# Patient Record
Sex: Female | Born: 1935 | Race: Black or African American | Hispanic: No | Smoking: Former smoker
Health system: Southern US, Community
[De-identification: ages and names within clinical notes are randomized; demographics above are authoritative.]

## PROBLEM LIST (undated history)

## (undated) DIAGNOSIS — I10 Essential (primary) hypertension: Secondary | ICD-10-CM

## (undated) HISTORY — PX: REPLACEMENT TOTAL KNEE: SUR1224

---

## 2004-08-05 ENCOUNTER — Ambulatory Visit: Payer: Self-pay | Admitting: Internal Medicine

## 2004-08-18 ENCOUNTER — Emergency Department: Payer: Self-pay | Admitting: Emergency Medicine

## 2005-01-10 ENCOUNTER — Ambulatory Visit: Payer: Self-pay | Admitting: Podiatry

## 2005-06-27 ENCOUNTER — Emergency Department: Payer: Self-pay | Admitting: Emergency Medicine

## 2005-09-03 ENCOUNTER — Ambulatory Visit: Payer: Self-pay | Admitting: Internal Medicine

## 2006-08-13 ENCOUNTER — Ambulatory Visit: Payer: Self-pay | Admitting: Internal Medicine

## 2007-08-18 ENCOUNTER — Ambulatory Visit: Payer: Self-pay | Admitting: Internal Medicine

## 2008-08-21 ENCOUNTER — Ambulatory Visit: Payer: Self-pay | Admitting: Internal Medicine

## 2009-08-22 ENCOUNTER — Ambulatory Visit: Payer: Self-pay | Admitting: Internal Medicine

## 2010-09-04 ENCOUNTER — Ambulatory Visit: Payer: Self-pay | Admitting: Internal Medicine

## 2010-09-05 ENCOUNTER — Ambulatory Visit: Payer: Self-pay | Admitting: Internal Medicine

## 2011-09-12 ENCOUNTER — Ambulatory Visit: Payer: Self-pay | Admitting: Internal Medicine

## 2012-09-14 ENCOUNTER — Ambulatory Visit: Payer: Self-pay | Admitting: Internal Medicine

## 2013-10-05 ENCOUNTER — Ambulatory Visit: Payer: Self-pay | Admitting: Internal Medicine

## 2017-05-13 ENCOUNTER — Other Ambulatory Visit (HOSPITAL_COMMUNITY): Payer: Self-pay | Admitting: Hematology

## 2017-05-18 ENCOUNTER — Other Ambulatory Visit (HOSPITAL_COMMUNITY): Payer: Self-pay | Admitting: Pharmacist

## 2020-03-23 ENCOUNTER — Encounter
Admission: EM | Disposition: A | Discharge status: Skilled Nursing Facility | Payer: Self-pay | Source: Home / Self Care | Attending: Internal Medicine

## 2020-03-23 ENCOUNTER — Emergency Department: Payer: Medicare HMO

## 2020-03-23 ENCOUNTER — Encounter: Payer: Self-pay | Admitting: Internal Medicine

## 2020-03-23 ENCOUNTER — Other Ambulatory Visit: Payer: Self-pay

## 2020-03-23 ENCOUNTER — Inpatient Hospital Stay: Payer: Medicare HMO | Admitting: Certified Registered"

## 2020-03-23 ENCOUNTER — Inpatient Hospital Stay: Payer: Medicare HMO

## 2020-03-23 ENCOUNTER — Inpatient Hospital Stay
Admission: EM | Admit: 2020-03-23 | Discharge: 2020-03-29 | DRG: 481 | Disposition: A | Discharge status: Skilled Nursing Facility | Payer: Medicare HMO | Attending: Family Medicine | Admitting: Family Medicine

## 2020-03-23 DIAGNOSIS — S72142A Displaced intertrochanteric fracture of left femur, initial encounter for closed fracture: Secondary | ICD-10-CM | POA: Diagnosis present

## 2020-03-23 DIAGNOSIS — Z6820 Body mass index (BMI) 20.0-20.9, adult: Secondary | ICD-10-CM | POA: Diagnosis not present

## 2020-03-23 DIAGNOSIS — Z419 Encounter for procedure for purposes other than remedying health state, unspecified: Secondary | ICD-10-CM

## 2020-03-23 DIAGNOSIS — D62 Acute posthemorrhagic anemia: Secondary | ICD-10-CM

## 2020-03-23 DIAGNOSIS — Z79899 Other long term (current) drug therapy: Secondary | ICD-10-CM

## 2020-03-23 DIAGNOSIS — W19XXXA Unspecified fall, initial encounter: Secondary | ICD-10-CM | POA: Diagnosis present

## 2020-03-23 DIAGNOSIS — Z87891 Personal history of nicotine dependence: Secondary | ICD-10-CM

## 2020-03-23 DIAGNOSIS — Z20822 Contact with and (suspected) exposure to covid-19: Secondary | ICD-10-CM | POA: Diagnosis present

## 2020-03-23 DIAGNOSIS — Y92009 Unspecified place in unspecified non-institutional (private) residence as the place of occurrence of the external cause: Secondary | ICD-10-CM

## 2020-03-23 DIAGNOSIS — I10 Essential (primary) hypertension: Secondary | ICD-10-CM | POA: Diagnosis present

## 2020-03-23 DIAGNOSIS — R451 Restlessness and agitation: Secondary | ICD-10-CM | POA: Diagnosis not present

## 2020-03-23 DIAGNOSIS — S72002A Fracture of unspecified part of neck of left femur, initial encounter for closed fracture: Secondary | ICD-10-CM | POA: Diagnosis present

## 2020-03-23 DIAGNOSIS — D696 Thrombocytopenia, unspecified: Secondary | ICD-10-CM | POA: Diagnosis present

## 2020-03-23 DIAGNOSIS — D649 Anemia, unspecified: Secondary | ICD-10-CM | POA: Diagnosis not present

## 2020-03-23 DIAGNOSIS — Z96651 Presence of right artificial knee joint: Secondary | ICD-10-CM | POA: Diagnosis present

## 2020-03-23 DIAGNOSIS — F05 Delirium due to known physiological condition: Secondary | ICD-10-CM | POA: Diagnosis not present

## 2020-03-23 DIAGNOSIS — Z66 Do not resuscitate: Secondary | ICD-10-CM | POA: Diagnosis present

## 2020-03-23 DIAGNOSIS — E440 Moderate protein-calorie malnutrition: Secondary | ICD-10-CM | POA: Diagnosis present

## 2020-03-23 DIAGNOSIS — Z8249 Family history of ischemic heart disease and other diseases of the circulatory system: Secondary | ICD-10-CM

## 2020-03-23 HISTORY — PX: INTRAMEDULLARY (IM) NAIL INTERTROCHANTERIC: SHX5875

## 2020-03-23 HISTORY — DX: Unspecified fall, initial encounter: W19.XXXA

## 2020-03-23 HISTORY — DX: Fracture of unspecified part of neck of left femur, initial encounter for closed fracture: S72.002A

## 2020-03-23 HISTORY — DX: Essential (primary) hypertension: I10

## 2020-03-23 LAB — BASIC METABOLIC PANEL
Anion gap: 6 (ref 5–15)
BUN: 22 mg/dL (ref 8–23)
CO2: 26 mmol/L (ref 22–32)
Calcium: 8.9 mg/dL (ref 8.9–10.3)
Chloride: 109 mmol/L (ref 98–111)
Creatinine, Ser: 0.83 mg/dL (ref 0.44–1.00)
GFR, Estimated: >60 mL/min (ref >60)
Glucose, Bld: 103 mg/dL — ABNORMAL HIGH (ref 70–99)
Potassium: 4 mmol/L (ref 3.5–5.1)
Sodium: 141 mmol/L (ref 135–145)

## 2020-03-23 LAB — CBC WITH DIFFERENTIAL/PLATELET
Abs Immature Granulocytes: 0.04 10*3/uL (ref 0.00–0.07)
Basophils Absolute: 0 10*3/uL (ref 0.0–0.1)
Basophils Relative: 0 %
Eosinophils Absolute: 0.1 10*3/uL (ref 0.0–0.5)
Eosinophils Relative: 1 %
HCT: 33.5 % — ABNORMAL LOW (ref 36.0–46.0)
Hemoglobin: 10.3 g/dL — ABNORMAL LOW (ref 12.0–15.0)
Immature Granulocytes: 1 %
Lymphocytes Relative: 21 %
Lymphs Abs: 1.2 10*3/uL (ref 0.7–4.0)
MCH: 29.5 pg (ref 26.0–34.0)
MCHC: 30.7 g/dL (ref 30.0–36.0)
MCV: 96 fL (ref 80.0–100.0)
Monocytes Absolute: 0.4 10*3/uL (ref 0.1–1.0)
Monocytes Relative: 7 %
Neutro Abs: 4.1 10*3/uL (ref 1.7–7.7)
Neutrophils Relative %: 70 %
Platelets: 171 10*3/uL (ref 150–400)
RBC: 3.49 MIL/uL — ABNORMAL LOW (ref 3.87–5.11)
RDW: 14.7 % (ref 11.5–15.5)
WBC: 5.9 10*3/uL (ref 4.0–10.5)
nRBC: 0 % (ref 0.0–0.2)

## 2020-03-23 LAB — PROTIME-INR
INR: 1 (ref 0.8–1.2)
Prothrombin Time: 12.8 seconds (ref 11.4–15.2)

## 2020-03-23 LAB — RESP PANEL BY RT-PCR (FLU A&B, COVID) ARPGX2
Influenza A by PCR: NEGATIVE
Influenza B by PCR: NEGATIVE
SARS Coronavirus 2 by RT PCR: NEGATIVE

## 2020-03-23 LAB — APTT: aPTT: 35 seconds (ref 24–36)

## 2020-03-23 SURGERY — FIXATION, FRACTURE, INTERTROCHANTERIC, WITH INTRAMEDULLARY ROD
Anesthesia: Spinal | Laterality: Left

## 2020-03-23 MED ORDER — ONDANSETRON HCL 4 MG/2ML IJ SOLN: 4.0000 mg | Freq: Three times a day (TID) | INTRAMUSCULAR | Status: DC | PRN

## 2020-03-23 MED ORDER — TRAMADOL HCL 50 MG PO TABS: 50.0000 mg | ORAL_TABLET | Freq: Four times a day (QID) | ORAL | Status: DC | PRN

## 2020-03-23 MED ORDER — FENTANYL CITRATE (PF) 100 MCG/2ML IJ SOLN
50.0000 ug | Freq: Once | INTRAMUSCULAR | Status: AC
  Administered 2020-03-23: 50 ug via INTRAVENOUS
  Filled 2020-03-23: qty 2

## 2020-03-23 MED ORDER — MORPHINE SULFATE (PF) 2 MG/ML IV SOLN: 1.0000 mg | INTRAVENOUS | Status: DC | PRN

## 2020-03-23 MED ORDER — SODIUM CHLORIDE 0.9 % IV SOLN: INTRAVENOUS | Status: DC

## 2020-03-23 MED ORDER — ENOXAPARIN SODIUM 40 MG/0.4ML ~~LOC~~ SOLN
40.0000 mg | SUBCUTANEOUS | Status: DC
  Administered 2020-03-24: 40 mg via SUBCUTANEOUS
  Filled 2020-03-23 (×2): qty 0.4

## 2020-03-23 MED ORDER — BUPIVACAINE HCL (PF) 0.5 % IJ SOLN
INTRAMUSCULAR | Status: DC | PRN
  Administered 2020-03-23: 30 mL

## 2020-03-23 MED ORDER — MEPERIDINE HCL 50 MG/ML IJ SOLN
INTRAMUSCULAR | Status: AC
  Filled 2020-03-23: qty 1

## 2020-03-23 MED ORDER — BUPIVACAINE HCL (PF) 0.5 % IJ SOLN
INTRAMUSCULAR | Status: DC | PRN
  Administered 2020-03-23: 2.8 mL via INTRATHECAL

## 2020-03-23 MED ORDER — VASOPRESSIN 20 UNIT/ML IV SOLN
INTRAVENOUS | Status: DC | PRN
  Administered 2020-03-23 (×2): 1 [IU] via INTRAVENOUS
  Administered 2020-03-23: 2 [IU] via INTRAVENOUS
  Administered 2020-03-23: 1 [IU] via INTRAVENOUS
  Administered 2020-03-23: 2 [IU] via INTRAVENOUS

## 2020-03-23 MED ORDER — LISINOPRIL 20 MG PO TABS
20.0000 mg | ORAL_TABLET | Freq: Every day | ORAL | Status: DC
Start: 2020-03-23 — End: 2020-03-27
  Filled 2020-03-23 (×3): qty 1

## 2020-03-23 MED ORDER — HYDROCHLOROTHIAZIDE 25 MG PO TABS
25.0000 mg | ORAL_TABLET | Freq: Every day | ORAL | Status: DC
  Administered 2020-03-26: 25 mg via ORAL
  Filled 2020-03-23 (×3): qty 1

## 2020-03-23 MED ORDER — LACTATED RINGERS IV SOLN: INTRAVENOUS | Status: DC

## 2020-03-23 MED ORDER — ONDANSETRON HCL 4 MG/2ML IJ SOLN: 4.0000 mg | Freq: Once | INTRAMUSCULAR | Status: DC | PRN

## 2020-03-23 MED ORDER — SENNOSIDES-DOCUSATE SODIUM 8.6-50 MG PO TABS: 1.0000 | ORAL_TABLET | Freq: Every evening | ORAL | Status: DC | PRN

## 2020-03-23 MED ORDER — METOCLOPRAMIDE HCL 5 MG/ML IJ SOLN: 5.0000 mg | Freq: Three times a day (TID) | INTRAMUSCULAR | Status: DC | PRN

## 2020-03-23 MED ORDER — FENTANYL CITRATE (PF) 100 MCG/2ML IJ SOLN: 25.0000 ug | INTRAMUSCULAR | Status: DC | PRN

## 2020-03-23 MED ORDER — ACETAMINOPHEN 500 MG PO TABS
1000.0000 mg | ORAL_TABLET | Freq: Three times a day (TID) | ORAL | Status: DC
  Administered 2020-03-23 – 2020-03-29 (×14): 1000 mg via ORAL
  Filled 2020-03-23 (×15): qty 2

## 2020-03-23 MED ORDER — ACETAMINOPHEN 325 MG PO TABS: 650.0000 mg | ORAL_TABLET | Freq: Four times a day (QID) | ORAL | Status: DC | PRN

## 2020-03-23 MED ORDER — ONDANSETRON HCL 4 MG PO TABS
4.0000 mg | ORAL_TABLET | Freq: Four times a day (QID) | ORAL | Status: DC | PRN
  Administered 2020-03-23: 4 mg via ORAL
  Filled 2020-03-23: qty 1

## 2020-03-23 MED ORDER — CEFAZOLIN SODIUM-DEXTROSE 1-4 GM/50ML-% IV SOLN
1.0000 g | Freq: Three times a day (TID) | INTRAVENOUS | Status: DC
  Administered 2020-03-23: 1 g via INTRAVENOUS
  Filled 2020-03-23 (×4): qty 50

## 2020-03-23 MED ORDER — METOCLOPRAMIDE HCL 10 MG PO TABS: 5.0000 mg | ORAL_TABLET | Freq: Three times a day (TID) | ORAL | Status: DC | PRN

## 2020-03-23 MED ORDER — PROPOFOL 500 MG/50ML IV EMUL
INTRAVENOUS | Status: AC
  Filled 2020-03-23: qty 50

## 2020-03-23 MED ORDER — EPHEDRINE SULFATE 50 MG/ML IJ SOLN
INTRAMUSCULAR | Status: DC | PRN
  Administered 2020-03-23: 5 mg via INTRAVENOUS

## 2020-03-23 MED ORDER — OXYCODONE HCL 5 MG PO TABS
2.5000 mg | ORAL_TABLET | ORAL | Status: DC | PRN
  Administered 2020-03-23 – 2020-03-24 (×2): 5 mg via ORAL
  Filled 2020-03-23: qty 1

## 2020-03-23 MED ORDER — PROPOFOL 500 MG/50ML IV EMUL
INTRAVENOUS | Status: DC | PRN
  Administered 2020-03-23: 50 ug/kg/min via INTRAVENOUS

## 2020-03-23 MED ORDER — CHLORHEXIDINE GLUCONATE CLOTH 2 % EX PADS
6.0000 | MEDICATED_PAD | Freq: Every day | CUTANEOUS | Status: DC
  Administered 2020-03-24 – 2020-03-27 (×2): 6 via TOPICAL

## 2020-03-23 MED ORDER — NEOMYCIN-POLYMYXIN B GU 40-200000 IR SOLN
Status: AC
  Filled 2020-03-23: qty 4

## 2020-03-23 MED ORDER — METHOCARBAMOL 1000 MG/10ML IJ SOLN
500.0000 mg | Freq: Four times a day (QID) | INTRAVENOUS | Status: DC | PRN
  Filled 2020-03-23: qty 5

## 2020-03-23 MED ORDER — MORPHINE SULFATE (PF) 4 MG/ML IV SOLN
4.0000 mg | Freq: Once | INTRAVENOUS | Status: AC
  Administered 2020-03-23: 4 mg via INTRAVENOUS
  Filled 2020-03-23: qty 1

## 2020-03-23 MED ORDER — CEFAZOLIN SODIUM-DEXTROSE 1-4 GM/50ML-% IV SOLN
INTRAVENOUS | Status: AC
  Filled 2020-03-23: qty 50

## 2020-03-23 MED ORDER — MEPERIDINE HCL 25 MG/ML IJ SOLN
12.5000 mg | Freq: Once | INTRAMUSCULAR | Status: AC
  Administered 2020-03-23: 12.5 mg via INTRAVENOUS

## 2020-03-23 MED ORDER — ADULT MULTIVITAMIN W/MINERALS CH
1.0000 | ORAL_TABLET | Freq: Every day | ORAL | Status: DC
  Administered 2020-03-24 – 2020-03-29 (×5): 1 via ORAL
  Filled 2020-03-23 (×6): qty 1

## 2020-03-23 MED ORDER — KETOROLAC TROMETHAMINE 15 MG/ML IJ SOLN
7.5000 mg | Freq: Four times a day (QID) | INTRAMUSCULAR | Status: AC
  Administered 2020-03-23 – 2020-03-24 (×4): 7.5 mg via INTRAVENOUS
  Filled 2020-03-23 (×4): qty 1

## 2020-03-23 MED ORDER — FENTANYL CITRATE (PF) 100 MCG/2ML IJ SOLN
INTRAMUSCULAR | Status: DC | PRN
  Administered 2020-03-23: 25 ug via INTRAVENOUS

## 2020-03-23 MED ORDER — HYDROMORPHONE HCL 1 MG/ML IJ SOLN
0.2500 mg | INTRAMUSCULAR | Status: DC | PRN
  Administered 2020-03-23: 0.5 mg via INTRAVENOUS
  Filled 2020-03-23: qty 1

## 2020-03-23 MED ORDER — HYDRALAZINE HCL 20 MG/ML IJ SOLN: 5.0000 mg | INTRAMUSCULAR | Status: DC | PRN

## 2020-03-23 MED ORDER — BUPIVACAINE HCL (PF) 0.5 % IJ SOLN
INTRAMUSCULAR | Status: AC
  Filled 2020-03-23: qty 30

## 2020-03-23 MED ORDER — OXYCODONE-ACETAMINOPHEN 5-325 MG PO TABS
1.0000 | ORAL_TABLET | ORAL | Status: DC | PRN
  Administered 2020-03-23: 1 via ORAL
  Filled 2020-03-23: qty 1

## 2020-03-23 MED ORDER — SODIUM CHLORIDE 0.9 % IR SOLN
Status: DC | PRN
  Administered 2020-03-23: 1000 mL

## 2020-03-23 MED ORDER — CEFAZOLIN SODIUM-DEXTROSE 1-4 GM/50ML-% IV SOLN
1.0000 g | Freq: Four times a day (QID) | INTRAVENOUS | Status: AC
  Administered 2020-03-23 – 2020-03-24 (×3): 1 g via INTRAVENOUS
  Filled 2020-03-23 (×3): qty 50

## 2020-03-23 MED ORDER — ONDANSETRON HCL 4 MG/2ML IJ SOLN
4.0000 mg | Freq: Once | INTRAMUSCULAR | Status: AC
  Administered 2020-03-23: 4 mg via INTRAVENOUS
  Filled 2020-03-23: qty 2

## 2020-03-23 MED ORDER — DOCUSATE SODIUM 100 MG PO CAPS
100.0000 mg | ORAL_CAPSULE | Freq: Two times a day (BID) | ORAL | Status: DC
  Administered 2020-03-23 – 2020-03-29 (×11): 100 mg via ORAL
  Filled 2020-03-23 (×11): qty 1

## 2020-03-23 MED ORDER — OXYCODONE HCL 5 MG PO TABS
5.0000 mg | ORAL_TABLET | ORAL | Status: DC | PRN
  Administered 2020-03-24: 5 mg via ORAL
  Filled 2020-03-23: qty 1
  Filled 2020-03-23: qty 2

## 2020-03-23 MED ORDER — METHOCARBAMOL 500 MG PO TABS
500.0000 mg | ORAL_TABLET | Freq: Four times a day (QID) | ORAL | Status: DC | PRN
  Administered 2020-03-23: 500 mg via ORAL
  Filled 2020-03-23: qty 1

## 2020-03-23 MED ORDER — ENSURE ENLIVE PO LIQD
237.0000 mL | Freq: Two times a day (BID) | ORAL | Status: DC
  Administered 2020-03-24 – 2020-03-29 (×8): 237 mL via ORAL
  Filled 2020-03-23: qty 237

## 2020-03-23 MED ORDER — BUPIVACAINE LIPOSOME 1.3 % IJ SUSP
INTRAMUSCULAR | Status: AC
  Filled 2020-03-23: qty 20

## 2020-03-23 MED ORDER — PHENYLEPHRINE HCL (PRESSORS) 10 MG/ML IV SOLN
INTRAVENOUS | Status: DC | PRN
  Administered 2020-03-23: 50 ug via INTRAVENOUS

## 2020-03-23 MED ORDER — LISINOPRIL-HYDROCHLOROTHIAZIDE 20-25 MG PO TABS: 1.0000 | ORAL_TABLET | Freq: Every day | ORAL | Status: DC

## 2020-03-23 MED ORDER — FLEET ENEMA 7-19 GM/118ML RE ENEM: 1.0000 | ENEMA | Freq: Once | RECTAL | Status: DC | PRN

## 2020-03-23 MED ORDER — PROPOFOL 500 MG/50ML IV EMUL
INTRAVENOUS | Status: DC | PRN
  Administered 2020-03-23: 20 mg via INTRAVENOUS

## 2020-03-23 MED ORDER — BUPIVACAINE LIPOSOME 1.3 % IJ SUSP
INTRAMUSCULAR | Status: DC | PRN
  Administered 2020-03-23: 20 mL

## 2020-03-23 MED ORDER — METHOCARBAMOL 500 MG PO TABS
500.0000 mg | ORAL_TABLET | Freq: Three times a day (TID) | ORAL | Status: DC | PRN
  Administered 2020-03-23: 500 mg via ORAL
  Filled 2020-03-23 (×2): qty 1

## 2020-03-23 MED ORDER — ONDANSETRON HCL 4 MG/2ML IJ SOLN: 4.0000 mg | Freq: Four times a day (QID) | INTRAMUSCULAR | Status: DC | PRN

## 2020-03-23 MED ORDER — BISACODYL 10 MG RE SUPP: 10.0000 mg | Freq: Every day | RECTAL | Status: DC | PRN

## 2020-03-23 MED ORDER — FENTANYL CITRATE (PF) 100 MCG/2ML IJ SOLN
INTRAMUSCULAR | Status: AC
  Filled 2020-03-23: qty 2

## 2020-03-23 SURGICAL SUPPLY — 48 items
BIT DRILL INTERTAN LAG SCREW (BIT) ×2 IMPLANT
BIT DRILL SHORT 4.0 (BIT) ×2 IMPLANT
BLADE SURG 15 STRL LF DISP TIS (BLADE) ×1 IMPLANT
BLADE SURG 15 STRL SS (BLADE) ×1
CHLORAPREP W/TINT 26 (MISCELLANEOUS) ×2 IMPLANT
COVER WAND RF STERILE (DRAPES) ×2 IMPLANT
DRAPE 3/4 80X56 (DRAPES) ×2 IMPLANT
DRAPE SURG 17X11 SM STRL (DRAPES) ×4 IMPLANT
DRAPE U-SHAPE 47X51 STRL (DRAPES) ×4 IMPLANT
DRILL BIT SHORT 4.0 (BIT) ×2
DRSG OPSITE POSTOP 3X4 (GAUZE/BANDAGES/DRESSINGS) ×6 IMPLANT
DRSG OPSITE POSTOP 4X6 (GAUZE/BANDAGES/DRESSINGS) ×6 IMPLANT
ELECT REM PT RETURN 9FT ADLT (ELECTROSURGICAL) ×2
ELECTRODE REM PT RTRN 9FT ADLT (ELECTROSURGICAL) ×1 IMPLANT
GLOVE SRG 8 PF TXTR STRL LF DI (GLOVE) ×1 IMPLANT
GLOVE SURG SYN 7.5  E (GLOVE) ×1
GLOVE SURG SYN 7.5 E (GLOVE) ×1 IMPLANT
GLOVE SURG UNDER POLY LF SZ8 (GLOVE) ×1
GOWN STRL REUS W/ TWL LRG LVL3 (GOWN DISPOSABLE) ×1 IMPLANT
GOWN STRL REUS W/ TWL XL LVL3 (GOWN DISPOSABLE) ×1 IMPLANT
GOWN STRL REUS W/TWL LRG LVL3 (GOWN DISPOSABLE) ×1
GOWN STRL REUS W/TWL XL LVL3 (GOWN DISPOSABLE) ×1
GUIDE PIN 3.2X343 (PIN) ×3
GUIDE PIN 3.2X343MM (PIN) ×3
GUIDE ROD 3.0 (MISCELLANEOUS) ×2
KIT PATIENT CARE HANA TABLE (KITS) ×2 IMPLANT
KIT TURNOVER KIT A (KITS) ×2 IMPLANT
MANIFOLD NEPTUNE II (INSTRUMENTS) ×2 IMPLANT
MAT ABSORB  FLUID 56X50 GRAY (MISCELLANEOUS) ×2
MAT ABSORB FLUID 56X50 GRAY (MISCELLANEOUS) ×2 IMPLANT
NAIL TRIGEN LEFT 10X38-125 (Nail) ×2 IMPLANT
NEEDLE FILTER BLUNT 18X 1/2SAF (NEEDLE) ×1
NEEDLE FILTER BLUNT 18X1 1/2 (NEEDLE) ×1 IMPLANT
NEEDLE HYPO 22GX1.5 SAFETY (NEEDLE) ×2 IMPLANT
NS IRRIG 1000ML POUR BTL (IV SOLUTION) ×2 IMPLANT
PACK HIP COMPR (MISCELLANEOUS) ×2 IMPLANT
PENCIL ELECTRO HAND CTR (MISCELLANEOUS) ×2 IMPLANT
PIN GUIDE 3.2X343MM (PIN) ×3 IMPLANT
ROD GUIDE 3.0 (MISCELLANEOUS) ×1 IMPLANT
SCREW LAG COMPR KIT 90/85 (Screw) ×2 IMPLANT
SCREW TRIGEN LOW PROF 5.0X37.5 (Screw) ×2 IMPLANT
SCREW TRIGEN LOW PROF 5.0X42.5 (Screw) ×2 IMPLANT
STAPLER SKIN PROX 35W (STAPLE) ×2 IMPLANT
SUT VIC AB 0 CT1 36 (SUTURE) ×2 IMPLANT
SUT VIC AB 2-0 CT2 27 (SUTURE) ×2 IMPLANT
SYR 10ML LL (SYRINGE) ×2 IMPLANT
SYR 30ML LL (SYRINGE) ×2 IMPLANT
TAPE CLOTH 3X10 WHT NS LF (GAUZE/BANDAGES/DRESSINGS) ×4 IMPLANT

## 2020-03-23 NOTE — H&P (Signed)
H&P reviewed. No significant changes noted.  

## 2020-03-23 NOTE — Op Note (Signed)
DATE OF SURGERY: 03/23/2020  PREOPERATIVE DIAGNOSIS: Left reverse obliquity intertrochanteric hip fracture  POSTOPERATIVE DIAGNOSIS: Left reverse obliquity hip fracture  PROCEDURE: Intramedullary nailing of L femur with cephalomedullary device  SURGEON: Cato Mulligan, MD  ANESTHESIA: spinal  EBL: 200 cc  IVF: per anesthesia record  COMPONENTS:  [employer] Trigen Intertan Long Nail: 10x346mm; 89mm lag screw with 63mm compression screw; 5x 37.47mm & 42.47mm distal cortical interlocking screws  INDICATIONS: Laura Michael is a 86 y.o. female who sustained a reverse obliquity intertrochanteric femur fracture after a fall. Risks and benefits of intramedullary nailing were explained to the patient and/or family . Risks include but are not limited to bleeding, infection, injury to tissues, nerves, vessels, nonunion/malunion, hardware failure, limb length discrepancy/hip rotation mismatch and risks of anesthesia. The patient and/or family understand these risks, have completed an informed consent, and wish to proceed.   PROCEDURE:  The patient was brought into the operating room. After administering anesthesia, the patient was placed in the supine position on the Hana table. The uninjured leg was placed in an extended position while the injured lower extremity was placed in longitudinal traction. The fracture was reduced using longitudinal traction and internal rotation. The adequacy of reduction was verified fluoroscopically in AP and lateral projections and found to be acceptable with a mallet under the femur to correct mild posterior sag. The lateral aspect of the hip and thigh were prepped with ChloraPrep solution before being draped sterilely. Preoperative IV antibiotics were administered. A timeout was performed to verify the appropriate surgical site, patient, and procedure.    The greater trochanter was identified and an approximately 6 cm incision was made about 3 fingerbreadths above  the tip of the greater trochanter. The incision was carried down through the subcutaneous tissues to expose the gluteal fascia. This was split the length of the incision, providing access to the tip of the trochanter. Under fluoroscopic guidance, a guidewire was drilled through the tip of the trochanter into the proximal metaphysis to the level of the lesser trochanter. After verifying its position fluoroscopically in AP and lateral projections, it was overreamed with the opening reamer to the level of the lesser trochanter. A guidewire was passed down through the femoral canal to the supracondylar region. The guidewire was overreamed sequentially using the flexible reamers. The nail was selected and advanced to the appropriate depth as verified fluoroscopically.    The guide system for the lag screw was positioned and advanced through an approximately 5cm incision over the lateral aspect of the proximal femur. The guidewire was drilled up through the femoral nail and into the femoral neck to rest within 5 mm of subchondral bone. After verifying its position in the femoral neck and head in both AP and lateral projections, the guidewire was measured and appropriate sized lag screw was selected.  The channel for the compression screw was drilled and antirotation bar was placed.  Lag screw was drilled and placed in appropriate position.  Compression screw was then placed.  Appropriate compression was achieved.  The set screw was locked in place. Again, the adequacy of hardware position and fracture reduction was verified fluoroscopically in AP and lateral projections.   Attention was directed distally. Using the "perfect circle" technique, the leg and fluoroscopy machine were positioned appropriately. A 2cm stab incision was made over the skin and IT band at the appropriate point before the drill bit was advanced through the cortex and across the static hole of the nail. Appropriate  screw length was determined with  a measuring guide. A distal interlocking screw was placed. This was repeated for a second distal interlocking screw. Again, the adequacy of screw position was verified fluoroscopically in AP and lateral projections.   The wounds were irrigated thoroughly with sterile saline solution. Local anesthetic was injected into the wounds. Deep fascia was closed with 0-Vicryl. The subcutaneous tissues were closed using 2-0 Vicryl interrupted sutures. The skin was closed using staples. Sterile occlusive dressings were applied to all wounds. The patient was then transferred to the recovery room in satisfactory condition.   POSTOPERATIVE PLAN: The patient will be WBAT on the operative extremity. Lovenox 40mg /day x 4 weeks to start on POD#1. Perioperative IV antibiotics x 24 hours. PT/OT on POD#1.

## 2020-03-23 NOTE — H&P (Signed)
History and Physical    Siboney Requejo WCH:[id number] DOB: Nov 26, 1934 DOA: 03/23/2020  Referring MD/NP/PA:   PCP: System, Provider Not In   Patient coming from:  The patient is coming from home.  At baseline, pt is independent for most of ADL.        Chief Complaint: fall and left hip and knee pain  HPI: Laura Michael is a 86 y.o. female with medical history significant of anemia, hypertension, who presents with fall and left hip and knee pain.  Pt states that she fell accidentally landing on her left side at about 4:00 AM when she was walking to the bathroom. She injured her left knee and hip. No LOC. No head or neck injury. She developed pain in left knee and left hip, which is constant, sharp, moderate to severe, nonradiating. States she cannot move the leg due to pain.  She denies chest pain, shortness breath, cough, fever or chills.  No nausea vomiting, diarrhea, abdominal pain, symptoms of UTI.  No facial droop or slurred speech.  Patient states that she never had heart problem.  No history of heart attack or CHF.  No leg edema currently.  ED Course: pt was found to have WBC 5.9, pending COVID-19 PCR, electrolytes renal function okay, temperature normal, blood pressure 134/81, heart rate 54, 76, RR 12, oxygen saturation 98% on room air. X-ray of left hip/pelvis showed displaced left hip intertrochanteric fracture. X-ray of the left knee is negative for fracture. Patient is admitted to [facility] bed as inpatient. Dr. Posey Pronto of Ortho is consulted.  Review of Systems:   General: no fevers, chills, no body weight gain, has fatigue HEENT: no blurry vision, hearing changes or sore throat Respiratory: no dyspnea, coughing, wheezing CV: no chest pain, no palpitations GI: no nausea, vomiting, abdominal pain, diarrhea, constipation GU: no dysuria, burning on urination, increased urinary frequency, hematuria  Ext: no leg edema Neuro: no unilateral weakness, numbness, or tingling, no  vision change or hearing loss. Has fall Skin: no rash, no skin tear. MSK: has pain in left hip and knee Heme: No easy bruising.  Travel history: No recent long distant travel.  Allergy: No Known Allergies  Past Medical History:  Diagnosis Date  . HTN (hypertension)     Past Surgical History:  Procedure Laterality Date  . REPLACEMENT TOTAL KNEE Right     Social History:  reports that she has quit smoking. She has never used smokeless tobacco. She reports that she does not drink alcohol and does not use drugs.  Family History:  Family History  Problem Relation Age of Onset  . Alzheimer's disease Father   . Hypertension Sister      Prior to Admission medications   Medication Sig Start Date End Date Taking? Authorizing Provider  lisinopril-hydrochlorothiazide (ZESTORETIC) 20-25 MG tablet Take 1 tablet by mouth daily. 01/24/20   [provider]    Physical Exam: Vitals:   03/23/20 0838 03/23/20 0900 03/23/20 0930 03/23/20 1049  BP: 134/81 124/70 129/75 134/82  Pulse: 76 77 80 90  Resp: 12 12 10 18   Temp:   98.4 F (36.9 C) 97.9 F (36.6 C)  TempSrc:   Oral Oral  SpO2: 98% 98% 100% 100%  Weight:      Height:       General: Not in acute distress HEENT:       Eyes: PERRL, EOMI, no scleral icterus.       ENT: No discharge from the ears and nose,  no pharynx injection, no tonsillar enlargement.        Neck: No JVD, no bruit, no mass felt. Heme: No neck lymph node enlargement. Cardiac: S1/S2, RRR, No murmurs, No gallops or rubs. Respiratory: No rales, wheezing, rhonchi or rubs. GI: Soft, nondistended, nontender, no rebound pain, no organomegaly, BS present. GU: No hematuria Ext: No pitting leg edema bilaterally. 1+DP/PT pulse bilaterally. Musculoskeletal: has tenderness in left knee and left hip. The left leg is shortened and externally rotated Skin: No rashes.  Neuro: Alert, oriented X3, cranial nerves II-XII grossly intact, moves all extremities normally.   Psych: Patient is not psychotic, no suicidal or hemocidal ideation.  Labs on Admission: I have personally reviewed following labs and imaging studies  CBC: Recent Labs  Lab 03/23/20 0629  WBC 5.9  NEUTROABS 4.1  HGB 10.3*  HCT 33.5*  MCV 96.0  PLT 761   Basic Metabolic Panel: Recent Labs  Lab 03/23/20 0629  NA 141  K 4.0  CL 109  CO2 26  GLUCOSE 103*  BUN 22  CREATININE 0.83  CALCIUM 8.9   GFR: Estimated Creatinine Clearance: 42.3 mL/min (by C-G formula based on SCr of 0.83 mg/dL). Liver Function Tests: No results for input(s): AST, ALT, ALKPHOS, BILITOT, PROT, ALBUMIN in the last 168 hours. No results for input(s): LIPASE, AMYLASE in the last 168 hours. No results for input(s): AMMONIA in the last 168 hours. Coagulation Profile: Recent Labs  Lab 03/23/20 0917  INR 1.0   Cardiac Enzymes: No results for input(s): CKTOTAL, CKMB, CKMBINDEX, TROPONINI in the last 168 hours. BNP (last 3 results) No results for input(s): PROBNP in the last 8760 hours. HbA1C: No results for input(s): HGBA1C in the last 72 hours. CBG: No results for input(s): GLUCAP in the last 168 hours. Lipid Profile: No results for input(s): CHOL, HDL, LDLCALC, TRIG, CHOLHDL, LDLDIRECT in the last 72 hours. Thyroid Function Tests: No results for input(s): TSH, T4TOTAL, FREET4, T3FREE, THYROIDAB in the last 72 hours. Anemia Panel: No results for input(s): VITAMINB12, FOLATE, FERRITIN, TIBC, IRON, RETICCTPCT in the last 72 hours. Urine analysis: No results found for: COLORURINE, APPEARANCEUR, LABSPEC, PHURINE, GLUCOSEU, HGBUR, BILIRUBINUR, KETONESUR, PROTEINUR, UROBILINOGEN, NITRITE, LEUKOCYTESUR Sepsis Labs: @LABRCNTIP (procalcitonin:4,lacticidven:4) ) Recent Results (from the past 240 hour(s))  Resp Panel by RT-PCR (Flu A&B, Covid) Nasopharyngeal Swab     Status: None   Collection Time: 03/23/20  8:40 AM   Specimen: Nasopharyngeal Swab; Nasopharyngeal(NP) swabs in vial transport medium   Result Value Ref Range Status   SARS Coronavirus 2 by RT PCR NEGATIVE NEGATIVE Final    Comment: (NOTE) SARS-CoV-2 target nucleic acids are NOT DETECTED.  The SARS-CoV-2 RNA is generally detectable in upper respiratory specimens during the acute phase of infection. The lowest concentration of SARS-CoV-2 viral copies this assay can detect is 138 copies/mL. A negative result does not preclude SARS-Cov-2 infection and should not be used as the sole basis for treatment or other patient management decisions. A negative result may occur with  improper specimen collection/handling, submission of specimen other than nasopharyngeal swab, presence of viral mutation(s) within the areas targeted by this assay, and inadequate number of viral copies(<138 copies/mL). A negative result must be combined with clinical observations, patient history, and epidemiological information. The expected result is Negative.  Fact Sheet for Patients:  EntrepreneurPulse.com.au  Fact Sheet for Healthcare Providers:  IncredibleEmployment.be  This test is no t yet approved or cleared by the [place] FDA and  has been authorized for detection and/or diagnosis of  SARS-CoV-2 by FDA under an Emergency Use Authorization (EUA). This EUA will remain  in effect (meaning this test can be used) for the duration of the COVID-19 declaration under Section 564(b)(1) of the Act, 21 U.S.C.section 360bbb-3(b)(1), unless the authorization is terminated  or revoked sooner.       Influenza A by PCR NEGATIVE NEGATIVE Final   Influenza B by PCR NEGATIVE NEGATIVE Final    Comment: (NOTE) The Xpert Xpress SARS-CoV-2/FLU/RSV plus assay is intended as an aid in the diagnosis of influenza from Nasopharyngeal swab specimens and should not be used as a sole basis for treatment. Nasal washings and aspirates are unacceptable for Xpert Xpress SARS-CoV-2/FLU/RSV testing.  Fact Sheet for  Patients: EntrepreneurPulse.com.au  Fact Sheet for Healthcare Providers: IncredibleEmployment.be  This test is not yet approved or cleared by the [place] FDA and has been authorized for detection and/or diagnosis of SARS-CoV-2 by FDA under an Emergency Use Authorization (EUA). This EUA will remain in effect (meaning this test can be used) for the duration of the COVID-19 declaration under Section 564(b)(1) of the Act, 21 U.S.C. section 360bbb-3(b)(1), unless the authorization is terminated or revoked.  Performed at [facility], [address] [place], [place] [address]      Radiological Exams on Admission: DG Chest 1 View  Result Date: 03/23/2020 CLINICAL DATA:  BB patient fell going to the bathroom this morning. Pain in the knee. EXAM: CHEST  1 VIEW COMPARISON:  None FINDINGS: Heart size is normal. The aorta is tortuous and partially calcified. There are no focal consolidations or pleural effusions. No pulmonary edema. Minimal bibasilar atelectasis. No pneumothorax or acute fracture. No free intraperitoneal air. Chronic changes in both shoulders. IMPRESSION: Minimal bibasilar atelectasis. Aortic atherosclerosis.  (ICD10-I70.0) Electronically Signed   By: Nolon Nations M.D.   On: 03/23/2020 08:30   CT HEAD WO CONTRAST  Result Date: 03/23/2020 CLINICAL DATA:  86 year old female status post fall landing on left side. EXAM: CT HEAD WITHOUT CONTRAST TECHNIQUE: Contiguous axial images were obtained from the base of the skull through the vertex without intravenous contrast. COMPARISON:  None. FINDINGS: Brain: Cerebral volume is within normal limits for age. No midline shift, ventriculomegaly, mass effect, evidence of mass lesion, intracranial hemorrhage or evidence of cortically based acute infarction. Tiny chronic lacunar infarct suspected in the left cerebellum on series 3, image 9. Otherwise gray-white matter differentiation is within  normal limits for age. No cortical encephalomalacia identified. Vascular: Mild Calcified atherosclerosis at the skull base. No suspicious intracranial vascular hyperdensity. Skull: No fracture identified.  Osteopenia at the skull base. Sinuses/Orbits: Visualized paranasal sinuses and mastoids are clear. Other: Visualized orbits and scalp soft tissues are within normal limits. IMPRESSION: No acute traumatic injury identified. Largely negative for age non contrast CT appearance of the brain. Electronically Signed   By: Genevie Ann M.D.   On: 03/23/2020 10:35   DG Knee Complete 4 Views Left  Result Date: 03/23/2020 CLINICAL DATA:  86 year old female status post fall at home. Pain and decreased range of motion. EXAM: LEFT KNEE - COMPLETE 4+ VIEW COMPARISON:  Report of left knee series 10/02/1998 (no images available). FINDINGS: Osteopenia. Degenerative spurring at the left knee, perhaps most pronounced in the patellofemoral compartment. Patella appears to remain intact. Trace if any joint effusion. Mild medial and lateral compartment joint space loss with possible mild chondrocalcinosis. No acute fracture or dislocation identified. Mild calcified peripheral vascular disease. IMPRESSION: Osteopenia and degenerative changes. No acute fracture or dislocation identified about the  left knee. Electronically Signed   By: Genevie Ann M.D.   On: 03/23/2020 07:24   DG HIP UNILAT WITH PELVIS 2-3 VIEWS LEFT  Result Date: 03/23/2020 CLINICAL DATA:  Fall, hip pain EXAM: DG HIP (WITH OR WITHOUT PELVIS) 2-3V LEFT COMPARISON:  None. FINDINGS: There is an acute angulated displaced left hip intertrochanteric fracture. Bones are osteopenic. Advanced degenerative arthropathy of the left hip with acetabular protrusio and sclerosis. Bony pelvis and right hip appear intact. IMPRESSION: Acute angulated displaced left hip intertrochanteric fracture. Osteopenia and degenerative changes Electronically Signed   By: Jerilynn Mages.  Shick M.D.   On: 03/23/2020  08:33     EKG: I have personally reviewed. Sinus rhythm, QTC 418, low voltage, early R wave progression, borderline left axis deviation  Assessment/Plan Active Problems:   Closed left hip fracture (HCC)   HTN (hypertension)   Fall   Normocytic anemia   Closed left hip fracture ([place]): X-ray showed acute angulated displaced left hip intertrochanteric fracture. Patient has moderate pain now. No neurovascular compromise. Orthopedic surgeon, Dr. Posey Pronto was consulted.   - will admit to Med-surg bed - Pain control: morphine prn and percocet - When necessary Zofran for nausea - Robaxin for muscle spasm - type and cross - INR/PTT - PT/OT when able to (not ordered now)  HTN (hypertension) -Prinzide -IV prn hydralazine  Fall -PT OT when able to  Normocytic anemia: Hemoglobin 10.3.  No baseline hemoglobin available -Follow-up of by CBC    DVT ppx: SCD Code Status: Full code Family Communication:  Yes, patient's son by phone Disposition Plan:  Anticipate discharge back to previous environment Consults called:  Dr. Posey Pronto Admission status and Level of care: Med-Surg:  as inpt      Status is: Inpatient  Remains inpatient appropriate because:Inpatient level of care appropriate due to severity of illness   Dispo: The patient is from: Home              Anticipated d/c is to: to be determined              Patient currently is not medically stable to d/c.   Difficult to place patient No            Date of Service 03/23/2020    Ivor [facility]   If 7PM-7AM, please contact night-coverage [email or web address] 03/23/2020, 12:07 PM

## 2020-03-23 NOTE — Consult Note (Signed)
ORTHOPAEDIC CONSULTATION  REQUESTING PHYSICIAN: Ivor Costa, MD  Chief Complaint:   L hip pain  History of Present Illness: Laura Michael is a 86 y.o. female with a past medical history of hypertension who had a fall earlier today at home.  She lives with her son.  The patient's other son is at the bedside today.  She ambulates with a cane outside of the house but ambulates unassisted at home.  The patient noted immediate hip pain after the fall and an inability to ambulate.  Pain is described as sharp at its worst and a dull ache at its best.  Pain is rated a 10 out of 10 in severity.  Pain is improved with rest and immobilization.  Pain is worse with any sort of movement.  X-rays in the emergency department show a left reverse obliquity intertrochanteric hip fracture.  Past Medical History:  Diagnosis Date  . HTN (hypertension)    Past Surgical History:  Procedure Laterality Date  . REPLACEMENT TOTAL KNEE Right    Social History   Socioeconomic History  . Marital status: Widowed    Spouse name: Not on file  . Number of children: Not on file  . Years of education: Not on file  . Highest education level: Not on file  Occupational History  . Not on file  Tobacco Use  . Smoking status: Former Research scientist (life sciences)  . Smokeless tobacco: Never Used  Substance and Sexual Activity  . Alcohol use: Never  . Drug use: Never  . Sexual activity: Not on file  Other Topics Concern  . Not on file  Social History Narrative  . Not on file   Social Determinants of Health   Financial Resource Strain: Not on file  Food Insecurity: Not on file  Transportation Needs: Not on file  Physical Activity: Not on file  Stress: Not on file  Social Connections: Not on file   Family History  Problem Relation Age of Onset  . Alzheimer's disease Father   . Hypertension Sister    No Known Allergies Prior to Admission medications   Medication  Sig Start Date End Date Taking? Authorizing Provider  lisinopril-hydrochlorothiazide (ZESTORETIC) 20-25 MG tablet Take 1 tablet by mouth daily. 01/24/20  Yes [provider]   Recent Labs    03/23/20 5638 03/23/20 0917  WBC 5.9  --   HGB 10.3*  --   HCT 33.5*  --   PLT 171  --   K 4.0  --   CL 109  --   CO2 26  --   BUN 22  --   CREATININE 0.83  --   GLUCOSE 103*  --   CALCIUM 8.9  --   INR  --  1.0   DG Chest 1 View  Result Date: 03/23/2020 CLINICAL DATA:  BB patient fell going to the bathroom this morning. Pain in the knee. EXAM: CHEST  1 VIEW COMPARISON:  None FINDINGS: Heart size is normal. The aorta is tortuous and partially calcified. There are no focal consolidations or pleural effusions. No pulmonary edema. Minimal bibasilar atelectasis. No pneumothorax or acute fracture. No free intraperitoneal air. Chronic changes in both shoulders. IMPRESSION: Minimal bibasilar atelectasis. Aortic atherosclerosis.  (ICD10-I70.0) Electronically Signed   By: Nolon Nations M.D.   On: 03/23/2020 08:30   CT HEAD WO CONTRAST  Result Date: 03/23/2020 CLINICAL DATA:  86 year old female status post fall landing on left side. EXAM: CT HEAD WITHOUT CONTRAST TECHNIQUE: Contiguous axial images were obtained from  the base of the skull through the vertex without intravenous contrast. COMPARISON:  None. FINDINGS: Brain: Cerebral volume is within normal limits for age. No midline shift, ventriculomegaly, mass effect, evidence of mass lesion, intracranial hemorrhage or evidence of cortically based acute infarction. Tiny chronic lacunar infarct suspected in the left cerebellum on series 3, image 9. Otherwise gray-white matter differentiation is within normal limits for age. No cortical encephalomalacia identified. Vascular: Mild Calcified atherosclerosis at the skull base. No suspicious intracranial vascular hyperdensity. Skull: No fracture identified.  Osteopenia at the skull base. Sinuses/Orbits:  Visualized paranasal sinuses and mastoids are clear. Other: Visualized orbits and scalp soft tissues are within normal limits. IMPRESSION: No acute traumatic injury identified. Largely negative for age non contrast CT appearance of the brain. Electronically Signed   By: Genevie Ann M.D.   On: 03/23/2020 10:35   DG Knee Complete 4 Views Left  Result Date: 03/23/2020 CLINICAL DATA:  86 year old female status post fall at home. Pain and decreased range of motion. EXAM: LEFT KNEE - COMPLETE 4+ VIEW COMPARISON:  Report of left knee series 10/02/1998 (no images available). FINDINGS: Osteopenia. Degenerative spurring at the left knee, perhaps most pronounced in the patellofemoral compartment. Patella appears to remain intact. Trace if any joint effusion. Mild medial and lateral compartment joint space loss with possible mild chondrocalcinosis. No acute fracture or dislocation identified. Mild calcified peripheral vascular disease. IMPRESSION: Osteopenia and degenerative changes. No acute fracture or dislocation identified about the left knee. Electronically Signed   By: Genevie Ann M.D.   On: 03/23/2020 07:24   DG HIP UNILAT WITH PELVIS 2-3 VIEWS LEFT  Result Date: 03/23/2020 CLINICAL DATA:  Fall, hip pain EXAM: DG HIP (WITH OR WITHOUT PELVIS) 2-3V LEFT COMPARISON:  None. FINDINGS: There is an acute angulated displaced left hip intertrochanteric fracture. Bones are osteopenic. Advanced degenerative arthropathy of the left hip with acetabular protrusio and sclerosis. Bony pelvis and right hip appear intact. IMPRESSION: Acute angulated displaced left hip intertrochanteric fracture. Osteopenia and degenerative changes Electronically Signed   By: Jerilynn Mages.  Shick M.D.   On: 03/23/2020 08:33     Positive ROS: All other systems have been reviewed and were otherwise negative with the exception of those mentioned in the HPI and as above.  Physical Exam: BP 134/82 (BP Location: Right Arm)   Pulse 90   Temp 97.9 F (36.6 C)  (Oral)   Resp 18   Ht 5\' 3"  (1.6 m)   Wt 52.2 kg   SpO2 100%   BMI 20.37 kg/m  General:  Alert, no acute distress Psychiatric:  Patient is competent for consent with normal mood and affect   Cardiovascular:  No pedal edema, regular rate and rhythm Respiratory:  No wheezing, non-labored breathing GI:  Abdomen is soft and non-tender Skin:  No lesions in the area of chief complaint, no erythema Neurologic:  Sensation intact distally, CN grossly intact Lymphatic:  No axillary or cervical lymphadenopathy  Orthopedic Exam:  LLE: + DF/PF/EHL SILT grossly over foot Foot wwp +Log roll/axial load Leg shortened and externally rotated  X-rays:  As above: L reverse obliquity intertrochanteric hip fracture  Assessment/Plan: Laura Michael is a 86 y.o. female with a L reverse obliquity intertrochanteric hip fracture   1. I discussed the various treatment options including both surgical and non-surgical management of the fracture with the patient and/or family (medical PoA). We discussed the high risk of perioperative complications due to patient's age and other co-morbidities. After discussion of risks, benefits, and  alternatives to surgery, the family and/or patient were in agreement to proceed with surgery. The goals of surgery would be to provide adequate pain relief and allow for mobilization. Plan for surgery is L hip cephalomedullary nailing today, 03/23/2020. 2. NPO until OR 3. Hold anticoagulation in advance of OR    Leim Fabry   03/23/2020 11:58 AM

## 2020-03-23 NOTE — Transfer of Care (Signed)
Immediate Anesthesia Transfer of Care Note  Patient: Laura Michael  Procedure(s) Performed: INTRAMEDULLARY (IM) NAIL INTERTROCHANTRIC (Left )  Patient Location: PACU  Anesthesia Type:Spinal  Level of Consciousness: awake, alert  and drowsy  Airway & Oxygen Therapy: Patient Spontanous Breathing and Patient connected to face mask oxygen  Post-op Assessment: Report given to RN and Post -op Vital signs reviewed and stable  Post vital signs: Reviewed and stable  Last Vitals:  Vitals Value Taken Time  BP 112/74 03/23/20 1625  Temp    Pulse 71 03/23/20 1626  Resp 15 03/23/20 1626  SpO2 99 % 03/23/20 1626  Vitals shown include unvalidated device data.  Last Pain:  Vitals:   03/23/20 1220  TempSrc: Temporal  PainSc: 8          Complications: No complications documented.

## 2020-03-23 NOTE — Anesthesia Preprocedure Evaluation (Signed)
Anesthesia Evaluation  Patient identified by MRN, date of birth, ID band Patient awake    Reviewed: Allergy & Precautions, H&P , NPO status , Patient's Chart, lab work & pertinent test results, reviewed documented beta blocker date and time   Airway Mallampati: II   Neck ROM: full    Dental  (+) Poor Dentition   Pulmonary neg pulmonary ROS, former smoker,    Pulmonary exam normal        Cardiovascular Exercise Tolerance: Poor hypertension, On Medications negative cardio ROS Normal cardiovascular exam Rhythm:regular Rate:Normal     Neuro/Psych negative neurological ROS  negative psych ROS   GI/Hepatic negative GI ROS, Neg liver ROS,   Endo/Other  negative endocrine ROS  Renal/GU negative Renal ROS  negative genitourinary   Musculoskeletal   Abdominal   Peds  Hematology  (+) Blood dyscrasia, anemia ,   Anesthesia Other Findings Past Medical History: No date: HTN (hypertension) Past Surgical History: No date: REPLACEMENT TOTAL KNEE; Right BMI    Body Mass Index: 20.37 kg/m     Reproductive/Obstetrics negative OB ROS                             Anesthesia Physical Anesthesia Plan  ASA: II and emergent  Anesthesia Plan: Spinal   Post-op Pain Management:    Induction:   PONV Risk Score and Plan: 3  Airway Management Planned:   Additional Equipment:   Intra-op Plan:   Post-operative Plan:   Informed Consent: I have reviewed the patients History and Physical, chart, labs and discussed the procedure including the risks, benefits and alternatives for the proposed anesthesia with the patient or authorized representative who has indicated his/her understanding and acceptance.     Dental Advisory Given  Plan Discussed with: CRNA  Anesthesia Plan Comments:         Anesthesia Quick Evaluation

## 2020-03-23 NOTE — ED Notes (Signed)
Pt transported to radiology.

## 2020-03-23 NOTE — Progress Notes (Signed)
Initial Nutrition Assessment  DOCUMENTATION CODES:   Not applicable  INTERVENTION:  Provide Ensure Enlive po BID, each supplement provides 350 kcal and 20 grams of protein.  Provide MVI po daily.  NUTRITION DIAGNOSIS:   Increased nutrient needs related to post-op healing as evidenced by estimated needs.  GOAL:   Patient will meet greater than or equal to 90% of their needs  MONITOR:   PO intake,Supplement acceptance,Diet advancement,Labs,Weight trends,Skin,I & O's  REASON FOR ASSESSMENT:   Malnutrition Screening Tool,Consult Assessment of nutrition requirement/status  ASSESSMENT:   86 year old female with PMHx of HTN who presents after a fall found to have left reverse obliquity intertrochanteric hip fracture.   Unable to meet with patient at bedside. She was in the ED this AM and is now in the OR for surgery. Patient was NPO this morning prior to surgery. She will benefit from oral nutrition supplements after surgery to promote post-op healing.  No weight history available in chart to trend. Patient currently documented to be 52.2 kg (115 lbs).  Medications reviewed and include: LR at 100 mL/hr  Labs reviewed.  NUTRITION - FOCUSED PHYSICAL EXAM:  Unable to complete at this time as patient is in OR.  Diet Order:   Diet Order            Diet NPO time specified Except for: Ice Chips, Sips with Meds  Diet effective now                EDUCATION NEEDS:   No education needs have been identified at this time  Skin:  Skin Assessment:  (RN skin assessment not documented at this time)  Last BM:  Unknown  Height:   Ht Readings from Last 1 Encounters:  03/23/20 5\' 3"  (1.6 m)   Weight:   Wt Readings from Last 1 Encounters:  03/23/20 52.2 kg   BMI:  Body mass index is 20.37 kg/m.  Estimated Nutritional Needs:   Kcal:  1400-1600  Protein:  70-80 grams  Fluid:  1.4-1.6 L/day  Jacklynn Barnacle, MS, RD, LDN Pager number available on Amion

## 2020-03-23 NOTE — ED Triage Notes (Signed)
Pt arrives to ED from home via [facility] with c/c of fall with left leg/knee pain. EMS reports transport vitals of p50, 116/95, r18. O2 sat 100% room air. Upon arrival pt A&Ox4, in evident discomfort.

## 2020-03-23 NOTE — ED Provider Notes (Signed)
[facility] Emergency Department Provider Note  Time seen: 7:26 AM  I have reviewed the triage vital signs and the nursing notes.   HISTORY  Chief Complaint Fall and Leg Pain (Left knee/ lower leg)   HPI Laura Michael is a 86 y.o. female with a past medical history of hypertension presents to the emergency department for a fall.  According to the patient she fell landing on her left side.  Patient states she had been having knee trouble/pain for some time and is now complaining of left knee pain.  States she cannot move the leg due to pain.  Has not been able to ambulate since the fall.  Patient denies hitting her head.  No LOC.  No blood thinners.   States significant pain aching type pain in the left knee, worse with any movement.  No past medical history on file.  There are no problems to display for this patient.   Prior to Admission medications   Medication Sig Start Date End Date Taking? Authorizing Provider  lisinopril-hydrochlorothiazide (ZESTORETIC) 20-25 MG tablet Take 1 tablet by mouth daily. 01/24/20   [provider]    Not on File  No family history on file.  Social History    Review of Systems Constitutional: Negative for head injury or LOC. Cardiovascular: Negative for chest pain. Respiratory: Negative for shortness of breath. Gastrointestinal: Negative for abdominal pain, vomiting  Genitourinary: States mild dysuria Musculoskeletal: Significant left knee pain worse with movement. Neurological: Negative for headache All other ROS negative  ____________________________________________   PHYSICAL EXAM:  VITAL SIGNS: ED Triage Vitals  Enc Vitals Group     BP 03/23/20 0626 131/73     Pulse Rate 03/23/20 0626 (!) 54     Resp 03/23/20 0626 12     Temp 03/23/20 0629 98.1 F (36.7 C)     Temp Source 03/23/20 0629 Oral     SpO2 03/23/20 0626 100 %     Weight 03/23/20 0626 115 lb (52.2 kg)     Height 03/23/20 0626 5'  3" (1.6 m)     Head Circumference --      Peak Flow --      Pain Score 03/23/20 0626 10     Pain Loc --      Pain Edu? --      Excl. in [place]? --    Constitutional: Alert and oriented. Well appearing and in no distress. Eyes: Normal exam ENT      Head: Normocephalic and atraumatic.      Mouth/Throat: Mucous membranes are moist. Cardiovascular: Normal rate, regular rhythm.  Respiratory: Normal respiratory effort without tachypnea nor retractions. Breath sounds are clear Gastrointestinal: Soft and nontender. No distention.   Musculoskeletal: Left hip tenderness to palpation.  Shortened and externally rotated left lower extremity.  No significant left knee tenderness.  Neuro vastly intact distally. Neurologic:  Normal speech and language. No gross focal neurologic deficits  Skin:  Skin is warm, dry and intact.  Psychiatric: Mood and affect are normal.   ____________________________________________    EKG  EKG viewed and interpreted by myself shows a normal sinus rhythm at 52 bpm with a narrow QRS, normal axis, largely normal intervals with nonspecific ST changes.  ____________________________________________    RADIOLOGY  X-ray of the knee shows osteopenia and degenerative changes but no acute fracture or dislocation.   Acute angulated displaced left hip intertrochanteric fracture.   Osteopenia and degenerative changes   ____________________________________________   INITIAL IMPRESSION /  ASSESSMENT AND PLAN / ED COURSE  Pertinent labs & imaging results that were available during my care of the patient were reviewed by me and considered in my medical decision making (see chart for details).   Patient presents to the emergency department after fall complaining of left knee pain.  However on my examination the patient appears to be more tender in the left hip and has a shortened and externally rotated left lower extremity.  Remains neurovascularly intact distally.  X-rays of the  knee are largely nonrevealing.  We will obtain x-rays of the hip for suspected hip fracture.  We will check labs EKG and a chest x-ray for preop purposes.  X-ray confirms left hip fracture.  Lab work largely within normal limits.  Patient believes the only prescription medication she takes is for high blood pressure.  States she has not eaten or drinking anything since last night.  We will continue to treat pain.  I spoke to Dr. Posey Pronto of orthopedics who states they may perform the operation later today.  We will keep the patient n.p.o. and admit to the hospitalist service for clearance.  Laura Michael was evaluated in Emergency Department on 03/23/2020 for the symptoms described in the history of present illness. She was evaluated in the context of the global COVID-19 pandemic, which necessitated consideration that the patient might be at risk for infection with the SARS-CoV-2 virus that causes COVID-19. Institutional protocols and algorithms that pertain to the evaluation of patients at risk for COVID-19 are in a state of rapid change based on information released by regulatory bodies including the CDC and federal and state organizations. These policies and algorithms were followed during the patient's care in the ED.  ____________________________________________   FINAL CLINICAL IMPRESSION(S) / ED DIAGNOSES  Left hip fracture   Laura Dark, MD 03/23/20 [phone]

## 2020-03-23 NOTE — Clinical Social Work Note (Signed)
CSW acknowledges HH/SNF consult. Please enter PT and OT orders to determine needs.  Dayton Scrape, [place]

## 2020-03-23 NOTE — Anesthesia Procedure Notes (Signed)
Performed by: Johney Maine, CRNA Pre-anesthesia Checklist: Patient identified, Emergency Drugs available, Suction available, Patient being monitored and Timeout performed Patient Re-evaluated:Patient Re-evaluated prior to induction Oxygen Delivery Method: Simple face mask Preoxygenation: Pre-oxygenation with 100% oxygen Induction Type: IV induction

## 2020-03-23 NOTE — Anesthesia Procedure Notes (Signed)
Spinal  Patient location during procedure: OR Start time: 03/23/2020 1:40 PM End time: 03/23/2020 1:44 PM Reason for block: at surgeon's request Staffing Performed: resident/CRNA  Anesthesiologist: Molli Barrows, MD Resident/CRNA: Johney Maine, CRNA Preanesthetic Checklist Completed: patient identified, IV checked, site marked, risks and benefits discussed, surgical consent, monitors and equipment checked, pre-op evaluation and timeout performed Spinal Block Patient position: right lateral decubitus Prep: ChloraPrep Patient monitoring: heart rate, continuous pulse ox, blood pressure and cardiac monitor Approach: midline Location: L3-4 Injection technique: single-shot Needle Needle type: Whitacre and Introducer  Needle gauge: 24 G Needle length: 9 cm Assessment Sensory level: T10 Additional Notes Negative paresthesia. Negative blood return. Positive free-flowing CSF. Expiration date of kit checked and confirmed. Patient tolerated procedure well, without complications x 1 attempt

## 2020-03-23 NOTE — Plan of Care (Signed)

## 2020-03-24 LAB — CBC
HCT: 23.6 % — ABNORMAL LOW (ref 36.0–46.0)
Hemoglobin: 7.6 g/dL — ABNORMAL LOW (ref 12.0–15.0)
MCH: 30.3 pg (ref 26.0–34.0)
MCHC: 32.2 g/dL (ref 30.0–36.0)
MCV: 94 fL (ref 80.0–100.0)
Platelets: 162 10*3/uL (ref 150–400)
RBC: 2.51 MIL/uL — ABNORMAL LOW (ref 3.87–5.11)
RDW: 15.1 % (ref 11.5–15.5)
WBC: 5.7 10*3/uL (ref 4.0–10.5)
nRBC: 0 % (ref 0.0–0.2)

## 2020-03-24 LAB — BASIC METABOLIC PANEL
Anion gap: 8 (ref 5–15)
BUN: 23 mg/dL (ref 8–23)
CO2: 23 mmol/L (ref 22–32)
Calcium: 8 mg/dL — ABNORMAL LOW (ref 8.9–10.3)
Chloride: 108 mmol/L (ref 98–111)
Creatinine, Ser: 0.85 mg/dL (ref 0.44–1.00)
GFR, Estimated: >60 mL/min (ref >60)
Glucose, Bld: 118 mg/dL — ABNORMAL HIGH (ref 70–99)
Potassium: 4 mmol/L (ref 3.5–5.1)
Sodium: 139 mmol/L (ref 135–145)

## 2020-03-24 MED ORDER — MELATONIN 3 MG PO TABS
3.0000 mg | ORAL_TABLET | Freq: Every day | ORAL | Status: DC
  Administered 2020-03-24 – 2020-03-28 (×5): 3 mg via ORAL
  Filled 2020-03-24 (×6): qty 1

## 2020-03-24 MED ORDER — HALOPERIDOL LACTATE 5 MG/ML IJ SOLN
2.5000 mg | Freq: Once | INTRAMUSCULAR | Status: DC
  Filled 2020-03-24: qty 1

## 2020-03-24 MED ORDER — HYDROMORPHONE HCL 1 MG/ML IJ SOLN
0.5000 mg | INTRAMUSCULAR | Status: DC | PRN
  Administered 2020-03-25: 0.5 mg via INTRAVENOUS
  Filled 2020-03-24: qty 1

## 2020-03-24 MED ORDER — HALOPERIDOL LACTATE 5 MG/ML IJ SOLN
2.5000 mg | Freq: Once | INTRAMUSCULAR | Status: AC
  Administered 2020-03-24: 2.5 mg via INTRAMUSCULAR

## 2020-03-24 NOTE — Evaluation (Signed)
Physical Therapy Evaluation Patient Details Name: Laura Michael MRN: [id number] DOB: 26-Sep-1934 Today's Date: 03/24/2020   History of Present Illness  Laura Michael is a 86 y.o. female with medical history significant of anemia, hypertension, who presents with fall and left hip and knee pain. Pt diagnosed with left reverse obliquity hip fracture and is s/p intramedullary nailing of L femur with cephalomedullary device, pt is touchdown WB on LLE  Clinical Impression  Patient received in the bed supine and willing to participate with physical therapy interventions today. Pt able to complete bed mobility with min assist and sit to stand transfer with moderate assist. Pt required verbal cues to complete transfers safely. Pt able to follow verbal cues appropriately. Educated patient on touchdown weight bearing precautions LLE, pt verbalized understanding. Pt ambulated 5 steps using RW, VCs provided for safety for AD management. Pt with decreased appropriate caregiver support at home is expected to discharge to SNF.      Follow Up Recommendations SNF    Equipment Recommendations  None recommended by PT    Recommendations for Other Services       Precautions / Restrictions Precautions Precautions: Fall (Touchdown weight bearing) Precaution Comments: LLE touchdown weightbearing Restrictions Weight Bearing Restrictions: Yes LLE Weight Bearing: Touchdown weight bearing      Mobility  Bed Mobility Overal bed mobility: Needs Assistance Bed Mobility: Supine to Sit;Sit to Supine     Supine to sit: Min assist Sit to supine: Mod assist   General bed mobility comments: assist with left LE with bed mobility this date    Transfers Overall transfer level: Needs assistance Equipment used: Rolling walker (2 wheeled) Transfers: Sit to/from Stand Sit to Stand: Min assist            Ambulation/Gait Ambulation/Gait assistance: Mod assist Gait Distance (Feet): 5 Feet Assistive  device: Rolling walker (2 wheeled) Gait Pattern/deviations: Step-to pattern;Antalgic     General Gait Details: Pt with increased L LE pain due to recent surgery  Stairs            Wheelchair Mobility    Modified Rankin (Stroke Patients Only)       Balance Overall balance assessment: Needs assistance   Sitting balance-Leahy Scale: Good Sitting balance - Comments: Pt with good seated balance sitting EOB with BIL     Standing balance-Leahy Scale: Fair Standing balance comment: Pt requires BIL UE assistance and mod A                             Pertinent Vitals/Pain Pain Assessment: 0-10 Pain Score: 3  Pain Location: left knee and hip Pain Descriptors / Indicators: Aching Pain Intervention(s): Limited activity within patient's tolerance;Repositioned;Monitored during session    Home Living Family/patient expects to be discharged to:: Private residence Living Arrangements: Children Available Help at Discharge: Family Type of Home: House Home Access: Stairs to enter Entrance Stairs-Rails: Can reach both Entrance Stairs-Number of Steps: 5 Home Layout: One level Home Equipment: Cane - single point      Prior Function Level of Independence: Needs assistance   Gait / Transfers Assistance Needed: Pt requires moderate A to complete gait training  ADL's / Homemaking Assistance Needed: Pt was independent with basic self care tasks, light meal prep, very minimal household chores.  Comments: Son lives with pt and occasionally provides assistance.  One of her other sons was present this date and reports his brother is an alcoholic and is not  able of caring for his mom 24 hours a day.  The son who was present takes the patient to the grocery store weekly.     Hand Dominance   Dominant Hand: Right    Extremity/Trunk Assessment   Upper Extremity Assessment Upper Extremity Assessment: Defer to OT evaluation    Lower Extremity Assessment Lower Extremity  Assessment: Generalized weakness    Cervical / Trunk Assessment Cervical / Trunk Assessment: Kyphotic  Communication   Communication: No difficulties  Cognition Arousal/Alertness: Awake/alert Behavior During Therapy: WFL for tasks assessed/performed Overall Cognitive Status: Within Functional Limits for tasks assessed                                        General Comments      Exercises     Assessment/Plan    PT Assessment Patient needs continued PT services  PT Problem List Decreased strength;Decreased mobility;Decreased range of motion;Decreased activity tolerance;Decreased balance;Decreased knowledge of use of DME;Decreased coordination;Decreased knowledge of precautions;Decreased safety awareness;Cardiopulmonary status limiting activity       PT Treatment Interventions Gait training;Balance training;Therapeutic exercise;DME instruction;Stair training;Neuromuscular re-education;Functional mobility training;Therapeutic activities;Patient/family education    PT Goals (Current goals can be found in the Care Plan section)  Acute Rehab PT Goals Patient Stated Goal: Walk wihtout pain PT Goal Formulation: With patient Time For Goal Achievement: 04/07/20 Potential to Achieve Goals: Good    Frequency BID   Barriers to discharge        Co-evaluation               AM-PAC PT "6 Clicks" Mobility  Outcome Measure Help needed turning from your back to your side while in a flat bed without using bedrails?: A Little Help needed moving from lying on your back to sitting on the side of a flat bed without using bedrails?: A Little Help needed moving to and from a bed to a chair (including a wheelchair)?: A Little Help needed standing up from a chair using your arms (e.g., wheelchair or bedside chair)?: A Little Help needed to walk in hospital room?: A Lot Help needed climbing 3-5 steps with a railing? : A Lot 6 Click Score: 16    End of Session Equipment  Utilized During Treatment: Gait belt Activity Tolerance: Patient tolerated treatment well Patient left: in chair;with chair alarm set Nurse Communication: Mobility status PT Visit Diagnosis: Unsteadiness on feet (R26.81);Muscle weakness (generalized) (M62.81);Difficulty in walking, not elsewhere classified (R26.2)    Time: 4332-9518 PT Time Calculation (min) (ACUTE ONLY): 32 min   Charges:   PT Evaluation $PT Eval Low Complexity: 1 Low PT Treatments $Therapeutic Activity: 8-22 mins        Duanne Guess, PT, DPT 03/24/20, 1:20 PM   Isaias Cowman 03/24/2020, 1:12 PM

## 2020-03-24 NOTE — Anesthesia Postprocedure Evaluation (Signed)
Anesthesia Post Note  Patient: Laura Michael  Procedure(s) Performed: INTRAMEDULLARY (IM) NAIL INTERTROCHANTRIC (Left )  Patient location during evaluation: Nursing Unit Anesthesia Type: Spinal Level of consciousness: oriented and awake and alert Pain management: pain level controlled Vital Signs Assessment: post-procedure vital signs reviewed and stable Respiratory status: spontaneous breathing Cardiovascular status: blood pressure returned to baseline and stable Postop Assessment: no headache, no backache, no apparent nausea or vomiting and patient able to bend at knees Anesthetic complications: no   No complications documented.   Last Vitals:  Vitals:   03/24/20 0801 03/24/20 1205  BP: 101/61 (!) 85/53  Pulse: 83 76  Resp: 17 19  Temp: 36.9 C (!) 36.4 C  SpO2: 97% 93%    Last Pain:  Vitals:   03/24/20 1000  TempSrc:   PainSc: 2                  Precious Haws Piscitello

## 2020-03-24 NOTE — Progress Notes (Signed)
PROGRESS NOTE    Laura Michael  SAY:[id number] DOB: [birth year] DOA: 03/23/2020 PCP: System, Provider Not In  Brief Narrative:  86 y.o. female with medical history significant of anemia, hypertension, who presents with fall and left hip and knee pain.  Pt states that she fell accidentally landing on her left side at about 4:00 AM when she was walking to the bathroom. She injured her left knee and hip. No LOC. No head or neck injury. She developed pain in left knee and left hip, which is constant, sharp, moderate to severe, nonradiating. States she cannot move the leg due to pain  X-ray of left hip/pelvis showed displaced left hip intertrochanteric fracture. X-ray of the left knee is negative for fracture. Patient is admitted to [facility] bed as inpatient. Dr. Posey Pronto of Ortho is consulted.  Status post IM nail left femur on 2/25.  Tolerated procedure well with no complications.  Assessment & Plan:   Active Problems:   Closed left hip fracture (HCC)   HTN (hypertension)   Fall   Normocytic anemia  Closed left hip fracture (HCC)  X-ray showed acute angulated displaced left hip intertrochanteric fracture.  Status post IM nail with orthopedics 2/25 Tolerated procedure well Postoperative pain mild Plan: Okay for diet Multimodal pain control PT and OT TOC consult Chemoprophylaxis  HTN (hypertension) -Prinzide -IV prn hydralazine  Fall Therapy evaluations TOC consult for SNF placement  Normocytic anemia:  Hemoglobin 10.3.  No baseline hemoglobin available No indication for transfusion Monitor CBC   DVT prophylaxis: SQ Lovenox Code Status: Full Family Communication: Left VM for son Arta Stump [phone] on 2/26 Disposition Plan: Status is: Inpatient  Remains inpatient appropriate because:Inpatient level of care appropriate due to severity of illness   Dispo: The patient is from: Home              Anticipated d/c is to: SNF              Patient currently is  not medically stable to d/c.   Difficult to place patient No  Postop day 1 status post IM nail.  Therapy evaluations.  TOC consult.  Anticipate need for skilled nursing facility at time of discharge.     Level of care: Med-Surg  Consultants:   Orthopedics  Procedures:   IM nail, 2/25  Antimicrobials:   None   Subjective: Patient seen and examined.  Mild pain in left lower extremity.  No other complaints.  Objective: Vitals:   03/23/20 2137 03/23/20 2323 03/24/20 0354 03/24/20 0801  BP: (!) 87/49 (!) 83/55 (!) 89/54 101/61  Pulse: 81 89 81 83  Resp: 16 16  17   Temp: 98 F (36.7 C) 98.8 F (37.1 C) 98.3 F (36.8 C) 98.5 F (36.9 C)  TempSrc:    Oral  SpO2: 98% 91% 92% 97%  Weight:      Height:        Intake/Output Summary (Last 24 hours) at 03/24/2020 1037 Last data filed at 03/24/2020 1001 Gross per 24 hour  Intake 950 ml  Output 630 ml  Net 320 ml   Filed Weights   03/23/20 0626  Weight: 52.2 kg    Examination:  General exam: Appears calm and comfortable  Respiratory system: Clear to auscultation. Respiratory effort normal. Cardiovascular system: S1 & S2 heard, RRR. No JVD, murmurs, rubs, gallops or clicks. No pedal edema. Gastrointestinal system: Abdomen is nondistended, soft and nontender. No organomegaly or masses felt. Normal bowel sounds heard. Central nervous system: Alert and  oriented. No focal neurological deficits. Extremities: Left lower extremity in surgical dressing.  Not removed.  Mild tender to palpation.  Good pedal pulses  skin: No rashes, lesions or ulcers Psychiatry: Judgement and insight appear normal. Mood & affect appropriate.     Data Reviewed: I have personally reviewed following labs and imaging studies  CBC: Recent Labs  Lab 03/23/20 0629 03/24/20 0546  WBC 5.9 5.7  NEUTROABS 4.1  --   HGB 10.3* 7.6*  HCT 33.5* 23.6*  MCV 96.0 94.0  PLT 171 676   Basic Metabolic Panel: Recent Labs  Lab 03/23/20 0629  03/24/20 0546  NA 141 139  K 4.0 4.0  CL 109 108  CO2 26 23  GLUCOSE 103* 118*  BUN 22 23  CREATININE 0.83 0.85  CALCIUM 8.9 8.0*   GFR: Estimated Creatinine Clearance: 41.3 mL/min (by C-G formula based on SCr of 0.85 mg/dL). Liver Function Tests: No results for input(s): AST, ALT, ALKPHOS, BILITOT, PROT, ALBUMIN in the last 168 hours. No results for input(s): LIPASE, AMYLASE in the last 168 hours. No results for input(s): AMMONIA in the last 168 hours. Coagulation Profile: Recent Labs  Lab 03/23/20 0917  INR 1.0   Cardiac Enzymes: No results for input(s): CKTOTAL, CKMB, CKMBINDEX, TROPONINI in the last 168 hours. BNP (last 3 results) No results for input(s): PROBNP in the last 8760 hours. HbA1C: No results for input(s): HGBA1C in the last 72 hours. CBG: No results for input(s): GLUCAP in the last 168 hours. Lipid Profile: No results for input(s): CHOL, HDL, LDLCALC, TRIG, CHOLHDL, LDLDIRECT in the last 72 hours. Thyroid Function Tests: No results for input(s): TSH, T4TOTAL, FREET4, T3FREE, THYROIDAB in the last 72 hours. Anemia Panel: No results for input(s): VITAMINB12, FOLATE, FERRITIN, TIBC, IRON, RETICCTPCT in the last 72 hours. Sepsis Labs: No results for input(s): PROCALCITON, LATICACIDVEN in the last 168 hours.  Recent Results (from the past 240 hour(s))  Resp Panel by RT-PCR (Flu A&B, Covid) Nasopharyngeal Swab     Status: None   Collection Time: 03/23/20  8:40 AM   Specimen: Nasopharyngeal Swab; Nasopharyngeal(NP) swabs in vial transport medium  Result Value Ref Range Status   SARS Coronavirus 2 by RT PCR NEGATIVE NEGATIVE Final    Comment: (NOTE) SARS-CoV-2 target nucleic acids are NOT DETECTED.  The SARS-CoV-2 RNA is generally detectable in upper respiratory specimens during the acute phase of infection. The lowest concentration of SARS-CoV-2 viral copies this assay can detect is 138 copies/mL. A negative result does not preclude SARS-Cov-2 infection  and should not be used as the sole basis for treatment or other patient management decisions. A negative result may occur with  improper specimen collection/handling, submission of specimen other than nasopharyngeal swab, presence of viral mutation(s) within the areas targeted by this assay, and inadequate number of viral copies(<138 copies/mL). A negative result must be combined with clinical observations, patient history, and epidemiological information. The expected result is Negative.  Fact Sheet for Patients:  EntrepreneurPulse.com.au  Fact Sheet for Healthcare Providers:  IncredibleEmployment.be  This test is no t yet approved or cleared by the [place] FDA and  has been authorized for detection and/or diagnosis of SARS-CoV-2 by FDA under an Emergency Use Authorization (EUA). This EUA will remain  in effect (meaning this test can be used) for the duration of the COVID-19 declaration under Section 564(b)(1) of the Act, 21 U.S.C.section 360bbb-3(b)(1), unless the authorization is terminated  or revoked sooner.       Influenza A by  PCR NEGATIVE NEGATIVE Final   Influenza B by PCR NEGATIVE NEGATIVE Final    Comment: (NOTE) The Xpert Xpress SARS-CoV-2/FLU/RSV plus assay is intended as an aid in the diagnosis of influenza from Nasopharyngeal swab specimens and should not be used as a sole basis for treatment. Nasal washings and aspirates are unacceptable for Xpert Xpress SARS-CoV-2/FLU/RSV testing.  Fact Sheet for Patients: EntrepreneurPulse.com.au  Fact Sheet for Healthcare Providers: IncredibleEmployment.be  This test is not yet approved or cleared by the [place] FDA and has been authorized for detection and/or diagnosis of SARS-CoV-2 by FDA under an Emergency Use Authorization (EUA). This EUA will remain in effect (meaning this test can be used) for the duration of the COVID-19 declaration  under Section 564(b)(1) of the Act, 21 U.S.C. section 360bbb-3(b)(1), unless the authorization is terminated or revoked.  Performed at [facility], [address] [place], [place] [address]          Radiology Studies: DG Chest 1 View  Result Date: 03/23/2020 CLINICAL DATA:  BB patient fell going to the bathroom this morning. Pain in the knee. EXAM: CHEST  1 VIEW COMPARISON:  None FINDINGS: Heart size is normal. The aorta is tortuous and partially calcified. There are no focal consolidations or pleural effusions. No pulmonary edema. Minimal bibasilar atelectasis. No pneumothorax or acute fracture. No free intraperitoneal air. Chronic changes in both shoulders. IMPRESSION: Minimal bibasilar atelectasis. Aortic atherosclerosis.  (ICD10-I70.0) Electronically Signed   By: Nolon Nations M.D.   On: 03/23/2020 08:30   CT HEAD WO CONTRAST  Result Date: 03/23/2020 CLINICAL DATA:  86 year old female status post fall landing on left side. EXAM: CT HEAD WITHOUT CONTRAST TECHNIQUE: Contiguous axial images were obtained from the base of the skull through the vertex without intravenous contrast. COMPARISON:  None. FINDINGS: Brain: Cerebral volume is within normal limits for age. No midline shift, ventriculomegaly, mass effect, evidence of mass lesion, intracranial hemorrhage or evidence of cortically based acute infarction. Tiny chronic lacunar infarct suspected in the left cerebellum on series 3, image 9. Otherwise gray-white matter differentiation is within normal limits for age. No cortical encephalomalacia identified. Vascular: Mild Calcified atherosclerosis at the skull base. No suspicious intracranial vascular hyperdensity. Skull: No fracture identified.  Osteopenia at the skull base. Sinuses/Orbits: Visualized paranasal sinuses and mastoids are clear. Other: Visualized orbits and scalp soft tissues are within normal limits. IMPRESSION: No acute traumatic injury identified. Largely negative  for age non contrast CT appearance of the brain. Electronically Signed   By: Genevie Ann M.D.   On: 03/23/2020 10:35   DG Knee Complete 4 Views Left  Result Date: 03/23/2020 CLINICAL DATA:  86 year old female status post fall at home. Pain and decreased range of motion. EXAM: LEFT KNEE - COMPLETE 4+ VIEW COMPARISON:  Report of left knee series 10/02/1998 (no images available). FINDINGS: Osteopenia. Degenerative spurring at the left knee, perhaps most pronounced in the patellofemoral compartment. Patella appears to remain intact. Trace if any joint effusion. Mild medial and lateral compartment joint space loss with possible mild chondrocalcinosis. No acute fracture or dislocation identified. Mild calcified peripheral vascular disease. IMPRESSION: Osteopenia and degenerative changes. No acute fracture or dislocation identified about the left knee. Electronically Signed   By: Genevie Ann M.D.   On: 03/23/2020 07:24   DG HIP OPERATIVE UNILAT W OR W/O PELVIS LEFT  Result Date: 03/23/2020 CLINICAL DATA:  Surgery EXAM: OPERATIVE left HIP (WITH PELVIS IF PERFORMED) 4 VIEWS TECHNIQUE: Fluoroscopic spot image(s) were submitted for interpretation post-operatively.  COMPARISON:  03/23/2020 FINDINGS: Four low resolution intraoperative spot views of the left hip. Total fluoroscopy time was 5 minutes 42 seconds. The images demonstrate intramedullary rodding and distal screw fixation of left femur for intertrochanteric hip fracture. IMPRESSION: Intraoperative fluoroscopic assistance provided during left hip surgery. Electronically Signed   By: Donavan Foil M.D.   On: 03/23/2020 17:13   DG HIP UNILAT WITH PELVIS 2-3 VIEWS LEFT  Result Date: 03/23/2020 CLINICAL DATA:  Fall, hip pain EXAM: DG HIP (WITH OR WITHOUT PELVIS) 2-3V LEFT COMPARISON:  None. FINDINGS: There is an acute angulated displaced left hip intertrochanteric fracture. Bones are osteopenic. Advanced degenerative arthropathy of the left hip with acetabular protrusio  and sclerosis. Bony pelvis and right hip appear intact. IMPRESSION: Acute angulated displaced left hip intertrochanteric fracture. Osteopenia and degenerative changes Electronically Signed   By: Jerilynn Mages.  Shick M.D.   On: 03/23/2020 08:33        Scheduled Meds: . acetaminophen  1,000 mg Oral Q8H  . Chlorhexidine Gluconate Cloth  6 each Topical Daily  . docusate sodium  100 mg Oral BID  . enoxaparin (LOVENOX) injection  40 mg Subcutaneous Q24H  . feeding supplement  237 mL Oral BID BM  . lisinopril  20 mg Oral Daily   Or  . hydrochlorothiazide  25 mg Oral Daily  . ketorolac  7.5 mg Intravenous Q6H  . multivitamin with minerals  1 tablet Oral Daily   Continuous Infusions: . sodium chloride 75 mL/hr at 03/24/20 0625  . methocarbamol (ROBAXIN) IV       LOS: 1 day    Time spent: 25 minutes    Sidney Ace, MD [facility] Pager [phone]  If 7PM-7AM, please contact night-coverage 03/24/2020, 10:37 AM

## 2020-03-24 NOTE — Evaluation (Signed)
Occupational Therapy Evaluation Patient Details Name: Laura Michael MRN: [id number] DOB: 06-05-1934 Today's Date: 03/24/2020    History of Present Illness Laura Michael is a 86 y.o. female with medical history significant of anemia, hypertension, who presents with fall and left hip and knee pain. Pt diagnosed with Left reverse obliquity hip fracture and is s/p  Intramedullary nailing of L femur with cephalomedullary device, pt is TTWB on LLE   Clinical Impression   Pt is a 86 yo female who was at home and got up to go to the bathroom and fell resulting in a left reverse obliquity hip fracture.  She was admitted to [facility] ([place] and underwent a intramedullary nailing of the left femur with cephalomedullary device and is now TTWB on LLE.  Patient seen for OT evaluation this date, one of her sons present for evaluation.  Pt presents with muscle weakness, pain, decreased ability to perform transfers, functional mobility and decreased ability to perform basic self care and IADL tasks.  Pt may need SNF for short term rehab prior to returning home since her son reports she will not have 24 hour care at home upon return.      Follow Up Recommendations  SNF    Equipment Recommendations       Recommendations for Other Services       Precautions / Restrictions Precautions Precautions: Fall Restrictions Weight Bearing Restrictions: Yes LLE Weight Bearing: Touchdown weight bearing      Mobility Bed Mobility Overal bed mobility: Needs Assistance Bed Mobility: Supine to Sit;Sit to Supine     Supine to sit: Min assist Sit to supine: Mod assist   General bed mobility comments: assist with left LE with bed mobility this date Patient Response: Cooperative  Transfers Overall transfer level: Needs assistance Equipment used: Rolling walker (2 wheeled) Transfers: Sit to/from Stand Sit to Stand: Min assist              Balance Overall balance assessment: Needs assistance   Sitting  balance-Leahy Scale: Good       Standing balance-Leahy Scale: Fair                             ADL either performed or assessed with clinical judgement   ADL Overall ADL's : Needs assistance/impaired Eating/Feeding: Bed level;Set up   Grooming: Sitting;Set up Grooming Details (indicate cue type and reason): Able to sit edge of bed for grooming tasks.  Unable to perform in standing due to limited weight bearing status, pain and balance. Upper Body Bathing: Minimal assistance   Lower Body Bathing: Moderate assistance   Upper Body Dressing : Minimal assistance   Lower Body Dressing: Moderate assistance   Toilet Transfer: Minimal assistance   Toileting- Clothing Manipulation and Hygiene: Moderate assistance               Vision Baseline Vision/History: No visual deficits       Perception     Praxis      Pertinent Vitals/Pain Pain Assessment: 0-10 Pain Score: 7  Pain Location: left knee and hip Pain Descriptors / Indicators: Aching Pain Intervention(s): Limited activity within patient's tolerance;Repositioned;Monitored during session     Hand Dominance Right   Extremity/Trunk Assessment Upper Extremity Assessment Upper Extremity Assessment: Generalized weakness   Lower Extremity Assessment Lower Extremity Assessment: Defer to PT evaluation       Communication Communication Communication: No difficulties   Cognition Arousal/Alertness: Awake/alert Behavior During Therapy: [place]  for tasks assessed/performed Overall Cognitive Status: Within Functional Limits for tasks assessed                                     General Comments       Exercises     Shoulder Instructions      Home Living Family/patient expects to be discharged to:: Private residence Living Arrangements: Children Available Help at Discharge: Family Type of Home: House Home Access: Stairs to enter Technical brewer of Steps: 5 Entrance Stairs-Rails:  Can reach both Home Layout: One level     Bathroom Shower/Tub: Tub/shower unit;Curtain   [place] - single point          Prior Functioning/Environment Level of Independence: Needs assistance    ADL's / Homemaking Assistance Needed: Pt was independent with basic self care tasks, light meal prep, very minimal household chores.   Comments: Son lives with pt and occasionally provides assistance.  One of her other sons was present this date and reports his brother is an alcoholic and is not able of caring for his mom 24 hours a day.  The son who was present takes the patient to the grocery store weekly.        OT Problem List: Decreased strength;Impaired balance (sitting and/or standing);Pain;Decreased range of motion;Decreased activity tolerance;Decreased knowledge of use of DME or AE      OT Treatment/Interventions: Self-care/ADL training;DME and/or AE instruction;Therapeutic activities;Balance training;Therapeutic exercise;Patient/family education    OT Goals(Current goals can be found in the care plan section) Acute Rehab OT Goals Patient Stated Goal: Pt would like to be able to walk again, be independent and eventually return home OT Goal Formulation: With patient Time For Goal Achievement: 04/07/20 Potential to Achieve Goals: Good ADL Goals Pt Will Perform Lower Body Dressing: (P) with supervision;with set-up Pt Will Transfer to Toilet: (P) with min guard assist  OT Frequency: Min 1X/week   Barriers to D/C: Decreased caregiver support          Co-evaluation              AM-PAC OT "6 Clicks" Daily Activity     Outcome Measure Help from another person eating meals?: None Help from another person taking care of personal grooming?: A Little Help from another person toileting, which includes using toliet, bedpan, or urinal?: A Lot Help from another person bathing (including washing, rinsing, drying)?: A Lot Help from  another person to put on and taking off regular upper body clothing?: A Little Help from another person to put on and taking off regular lower body clothing?: A Lot 6 Click Score: 16   End of Session Equipment Utilized During Treatment: Gait belt  Activity Tolerance: Patient limited by pain Patient left: in bed;with call bell/phone within reach;with bed alarm set;with family/visitor present  OT Visit Diagnosis: Unsteadiness on feet (R26.81);Other abnormalities of gait and mobility (R26.89);Muscle weakness (generalized) (M62.81);Pain Pain - Right/Left: Left Pain - part of body: Knee;Leg                Time: 0925-1000 OT Time Calculation (min): 35 min Charges:  OT General Charges $OT Visit: 1 Visit OT Evaluation $OT Eval Low Complexity: 1 Low OT Treatments $Self Care/Home Management : 8-22 mins  Laura Michael, OTR/L, CLT   Laura Michael 03/24/2020, 10:27 AM

## 2020-03-24 NOTE — Progress Notes (Signed)
Physical Therapy Treatment Patient Details Name: Laura Michael MRN: [id number] DOB: 17-Nov-1934 Today's Date: 03/24/2020    History of Present Illness Laura Michael is a 86 y.o. female with medical history significant of anemia, hypertension, who presents with fall and left hip and knee pain. Pt diagnosed with left reverse obliquity hip fracture and is s/p intramedullary nailing of L femur with cephalomedullary device, pt is touchdown WB on LLE    PT Comments    Pt seated in recliner upon PT arrival to pt room. Pt willing to participate with PT interventions despite her verbalization of pain in the LLE. Completed exercises as follows: quad sets with 10 sec hold, pillow squeezes x 10, LAQ x10 with VCs for pace and form, supine hip ABD/ADD AAROM x 10, ankle pumps x 20. Pt provided verbal and tactile cues for appropriate pace and form throughout interventions. Educated/reviewed LLE WB precautions, pt verbalized understanding. Completed sit<>stand transfer with modA from therapist, pt provided verbal cue for appropriate hand placement and to avoid pulling on the RW for safety. Pt able to ambulate with mod A using RW for 5 ft to EOB with multiple verbal cues to maintain WB precautions on LLE. Discussed with RN increased pt pain - RN aware. Pt left supine in bed with call bell and phone within reach, bed alarm set.  Follow Up Recommendations  SNF     Equipment Recommendations  None recommended by PT    Recommendations for Other Services       Precautions / Restrictions Precautions Precautions: Fall Precaution Comments: LLE touchdown weightbearing Restrictions Weight Bearing Restrictions: Yes LLE Weight Bearing: Touchdown weight bearing    Mobility  Bed Mobility Overal bed mobility: Needs Assistance Bed Mobility: Sit to Supine     Supine to sit: Min assist Sit to supine: Mod assist   General bed mobility comments: assist with left LE with bed mobility this date     Transfers Overall transfer level: Needs assistance Equipment used: Rolling walker (2 wheeled) Transfers: Sit to/from Stand Sit to Stand: Mod assist         General transfer comment: Pt required mod A to complete sit<>stand transfer, she was able to stand EOB with BIL UE support on RW  Ambulation/Gait Ambulation/Gait assistance: Mod assist Gait Distance (Feet): 5 Feet Assistive device: Rolling walker (2 wheeled) Gait Pattern/deviations: Step-to pattern;Antalgic     General Gait Details: Pt with increased L LE pain due to recent surgery, pt requiring VCs to maintain her WB precautions   Stairs             Wheelchair Mobility    Modified Rankin (Stroke Patients Only)       Balance Overall balance assessment: Needs assistance   Sitting balance-Leahy Scale: Good Sitting balance - Comments: Pt with good seated balance sitting EOB with BIL     Standing balance-Leahy Scale: Fair Standing balance comment: Pt requires BIL UE assistance and mod A                            Cognition Arousal/Alertness: Awake/alert Behavior During Therapy: WFL for tasks assessed/performed Overall Cognitive Status: Within Functional Limits for tasks assessed                                        Exercises Total Joint Exercises Ankle Circles/Pumps: AROM;Both;15 reps Quad Sets:  AROM;Left;10 reps (10 second hold) Towel Squeeze: AROM;Both;15 reps Heel Slides: AROM;Left;10 reps Hip ABduction/ADduction: AAROM;Left;10 reps Long Arc Quad: AROM;Left;10 reps    General Comments        Pertinent Vitals/Pain Pain Assessment: Faces Pain Score: 3  Faces Pain Scale: Hurts even more Pain Location: left knee and hip Pain Descriptors / Indicators: Aching Pain Intervention(s): Patient requesting pain meds-RN notified;Repositioned    Home Living Family/patient expects to be discharged to:: Private residence Living Arrangements: Children Available Help at  Discharge: Family Type of Home: House Home Access: Stairs to enter Entrance Stairs-Rails: Can reach both Home Layout: One level Home Equipment: Cane - single point      Prior Function Level of Independence: Needs assistance  Gait / Transfers Assistance Needed: Pt requires moderate A to complete gait training ADL's / Homemaking Assistance Needed: Pt was independent with basic self care tasks, light meal prep, very minimal household chores. Comments: Son lives with pt and occasionally provides assistance.  One of her other sons was present this date and reports his brother is an alcoholic and is not able of caring for his mom 24 hours a day.  The son who was present takes the patient to the grocery store weekly.   PT Goals (current goals can now be found in the care plan section) Acute Rehab PT Goals Patient Stated Goal: Walk without pain PT Goal Formulation: With patient Time For Goal Achievement: 04/07/20 Potential to Achieve Goals: Good Progress towards PT goals: Progressing toward goals    Frequency    BID      PT Plan      Co-evaluation              AM-PAC PT "6 Clicks" Mobility   Outcome Measure  Help needed turning from your back to your side while in a flat bed without using bedrails?: A Little Help needed moving from lying on your back to sitting on the side of a flat bed without using bedrails?: A Little Help needed moving to and from a bed to a chair (including a wheelchair)?: A Little Help needed standing up from a chair using your arms (e.g., wheelchair or bedside chair)?: A Little Help needed to walk in hospital room?: A Lot Help needed climbing 3-5 steps with a railing? : A Lot 6 Click Score: 16    End of Session Equipment Utilized During Treatment: Gait belt Activity Tolerance: Patient tolerated treatment well Patient left: in chair;with chair alarm set Nurse Communication: Mobility status PT Visit Diagnosis: Unsteadiness on feet (R26.81);Muscle  weakness (generalized) (M62.81);Difficulty in walking, not elsewhere classified (R26.2)     Time: 5809-9833 PT Time Calculation (min) (ACUTE ONLY): 30 min  Charges:  $Therapeutic Exercise: 8-22 mins $Therapeutic Activity: 8-22 mins                     Duanne Guess, PT, DPT 03/24/20, 3:47 PM    Isaias Cowman 03/24/2020, 3:40 PM

## 2020-03-24 NOTE — Progress Notes (Signed)
Subjective: 1 Day Post-Op Procedure(s) (LRB): INTRAMEDULLARY (IM) NAIL INTERTROCHANTRIC (Left) Patient reports pain as mild.   Patient is well, and has had no acute complaints or problems PT and care management to assist with discharge planning. Negative for chest pain and shortness of breath Fever: no Gastrointestinal:Negative for nausea and vomiting  Objective: Vital signs in last 24 hours: Temp:  [97.4 F (36.3 C)-98.8 F (37.1 C)] 98.5 F (36.9 C) (02/26 0801) Pulse Rate:  [72-90] 83 (02/26 0801) Resp:  [10-18] 17 (02/26 0801) BP: (83-144)/(49-88) 101/61 (02/26 0801) SpO2:  [91 %-100 %] 97 % (02/26 0801)  Intake/Output from previous day:  Intake/Output Summary (Last 24 hours) at 03/24/2020 0848 Last data filed at 03/23/2020 1712 Gross per 24 hour  Intake 800 ml  Output 630 ml  Net 170 ml    Intake/Output this shift: No intake/output data recorded.  Labs: Recent Labs    03/23/20 0629 03/24/20 0546  HGB 10.3* 7.6*   Recent Labs    03/23/20 0629 03/24/20 0546  WBC 5.9 5.7  RBC 3.49* 2.51*  HCT 33.5* 23.6*  PLT 171 162   Recent Labs    03/23/20 0629 03/24/20 0546  NA 141 139  K 4.0 4.0  CL 109 108  CO2 26 23  BUN 22 23  CREATININE 0.83 0.85  GLUCOSE 103* 118*  CALCIUM 8.9 8.0*   Recent Labs    03/23/20 0917  INR 1.0   EXAM General - Patient is Alert, Appropriate and Oriented Extremity - ABD soft Sensation intact distally Intact pulses distally Dorsiflexion/Plantar flexion intact Incision: dressing C/D/I No cellulitis present Dressing/Incision - clean, dry, no drainage Motor Function - intact, moving foot and toes well on exam. Abdomen soft with intact bowel sounds.  Past Medical History:  Diagnosis Date   HTN (hypertension)     Assessment/Plan: 1 Day Post-Op Procedure(s) (LRB): INTRAMEDULLARY (IM) NAIL INTERTROCHANTRIC (Left) Active Problems:   Closed left hip fracture (HCC)   HTN (hypertension)   Fall   Normocytic  anemia  Estimated body mass index is 20.37 kg/m as calculated from the following:   Height as of this encounter: 5\' 3"  (1.6 m).   Weight as of this encounter: 52.2 kg. Advance diet Up with therapy D/C IV fluids when tolerating po intake.  Labs reviewed this AM, Hg 7.6.  Monitor for possible transfusion. Begin working on BM. Up with therapy today, toe touch weightbearing to the left leg.  DVT Prophylaxis - Lovenox and SCDs Toe-touch weightbearing to the left leg.  Raquel Elsia Lasota, PA-C [facility] 03/24/2020, 8:48 AM

## 2020-03-25 LAB — ABO/RH: ABO/RH(D): O POS

## 2020-03-25 LAB — BASIC METABOLIC PANEL
Anion gap: 8 (ref 5–15)
BUN: 28 mg/dL — ABNORMAL HIGH (ref 8–23)
CO2: 21 mmol/L — ABNORMAL LOW (ref 22–32)
Calcium: 8 mg/dL — ABNORMAL LOW (ref 8.9–10.3)
Chloride: 111 mmol/L (ref 98–111)
Creatinine, Ser: 0.94 mg/dL (ref 0.44–1.00)
GFR, Estimated: >60 mL/min (ref >60)
Glucose, Bld: 109 mg/dL — ABNORMAL HIGH (ref 70–99)
Potassium: 3.6 mmol/L (ref 3.5–5.1)
Sodium: 140 mmol/L (ref 135–145)

## 2020-03-25 LAB — CBC
HCT: 19.8 % — ABNORMAL LOW (ref 36.0–46.0)
Hemoglobin: 6.1 g/dL — ABNORMAL LOW (ref 12.0–15.0)
MCH: 29.5 pg (ref 26.0–34.0)
MCHC: 30.8 g/dL (ref 30.0–36.0)
MCV: 95.7 fL (ref 80.0–100.0)
Platelets: 152 10*3/uL (ref 150–400)
RBC: 2.07 MIL/uL — ABNORMAL LOW (ref 3.87–5.11)
RDW: 15.5 % (ref 11.5–15.5)
WBC: 5.5 10*3/uL (ref 4.0–10.5)
nRBC: 0 % (ref 0.0–0.2)

## 2020-03-25 LAB — PREPARE RBC (CROSSMATCH)

## 2020-03-25 MED ORDER — ACETAMINOPHEN 325 MG PO TABS
650.0000 mg | ORAL_TABLET | Freq: Four times a day (QID) | ORAL | Status: DC | PRN
  Administered 2020-03-27: 650 mg via ORAL
  Filled 2020-03-25: qty 2

## 2020-03-25 MED ORDER — HALOPERIDOL LACTATE 5 MG/ML IJ SOLN: 2.0000 mg | Freq: Four times a day (QID) | INTRAMUSCULAR | Status: DC | PRN

## 2020-03-25 MED ORDER — DICLOFENAC SODIUM 1 % EX GEL
2.0000 g | Freq: Four times a day (QID) | CUTANEOUS | Status: DC
  Administered 2020-03-25 – 2020-03-29 (×13): 2 g via TOPICAL
  Filled 2020-03-25 (×2): qty 100

## 2020-03-25 MED ORDER — HALOPERIDOL 2 MG PO TABS
2.0000 mg | ORAL_TABLET | Freq: Four times a day (QID) | ORAL | Status: DC | PRN
  Filled 2020-03-25: qty 1

## 2020-03-25 MED ORDER — SODIUM CHLORIDE 0.9% IV SOLUTION: Freq: Once | INTRAVENOUS | Status: AC

## 2020-03-25 MED ORDER — OXYCODONE HCL 5 MG PO TABS
5.0000 mg | ORAL_TABLET | ORAL | Status: DC | PRN
  Administered 2020-03-26 – 2020-03-28 (×4): 5 mg via ORAL
  Filled 2020-03-25 (×4): qty 1

## 2020-03-25 MED ORDER — HALOPERIDOL LACTATE 5 MG/ML IJ SOLN
5.0000 mg | Freq: Once | INTRAMUSCULAR | Status: AC
  Administered 2020-03-25: 5 mg via INTRAVENOUS
  Filled 2020-03-25: qty 1

## 2020-03-25 NOTE — Progress Notes (Addendum)
PROGRESS NOTE    Laura Michael  XTK:[id number] DOB: 05-27-1934 DOA: 03/23/2020 PCP: System, Provider Not In  Brief Narrative:  86 y.o. female with medical history significant of anemia, hypertension, who presents with fall and left hip and knee pain.  Pt states that she fell accidentally landing on her left side at about 4:00 AM when she was walking to the bathroom. She injured her left knee and hip. No LOC. No head or neck injury. She developed pain in left knee and left hip, which is constant, sharp, moderate to severe, nonradiating. States she cannot move the leg due to pain  X-ray of left hip/pelvis showed displaced left hip intertrochanteric fracture. X-ray of the left knee is negative for fracture. Patient is admitted to [facility] bed as inpatient. Dr. Posey Pronto of Ortho is consulted.  Status post IM nail left femur on 2/25.  Tolerated procedure well with no complications.  Has had some postoperative delirium worse on the evening of 2/26.  Received 2 doses of IV Haldol overnight.   Assessment & Plan:   Active Problems:   Closed left hip fracture (HCC)   HTN (hypertension)   Fall   Normocytic anemia  Hospital-acquired postoperative delirium Patient became agitated on 2/26 evening Combative and confused, attempting to get out of bed Removed her IV line and refusing lab draws Plan: Discussed hospital-acquired delirium with patient's son.  Explained that nonpharmacologic measures are often the most effective.  Recommend frequent reorientation, up during the day, sleep at night.  Blinds open during the day.  Avoid sedatives or narcotics if able As needed Haldol IM/p.o. ordered for agitation not responding to nonpharmacologic methods Continue safety sitter  Acute on chronic postoperative blood loss anemia  Hemoglobin 10.3 on arrival No previous baseline hemoglobin Hemoglobin drifting down 2/27, hemoglobin 6.1 Plan: Transfuse 1 unit PRBC Posttransfusion H&H Daily  CBC  Closed left hip fracture (HCC)  X-ray showed acute angulated displaced left hip intertrochanteric fracture.  Status post IM nail with orthopedics 2/25 Tolerated procedure well Postoperative pain mild Plan: Multimodal pain control Avoid IV narcotics and sedatives if able Continue therapy evaluations as tolerated TOC consult for skilled nursing facility placement Chemoprophylaxis  HTN (hypertension) -Prinzide -IV prn hydralazine  Fall Therapy evaluations TOC consult for SNF placement     DVT prophylaxis: SQ Lovenox Code Status: Full Family Communication: Son at bedside, 2/27  disposition Plan: Status is: Inpatient  Remains inpatient appropriate because:Altered mental status and Inpatient level of care appropriate due to severity of illness   Dispo: The patient is from: Home              Anticipated d/c is to: SNF              Patient currently is not medically stable to d/c.   Difficult to place patient No   Patient recovering well from surgical standpoint however has developed hospital-acquired delirium.  Will need improvement in mental status and discontinuation of one-to-one sitter prior to disposition           Level of care: Med-Surg  Consultants:   Orthopedics  Procedures:   IM nail, 2/25  Antimicrobials:   None   Subjective: Patient seen and examined.  Lethargic and confused this morning.  Unable to elicit specific pain complaints.  Objective: Vitals:   03/24/20 0801 03/24/20 1205 03/24/20 1608 03/24/20 1933  BP: 101/61 (!) 85/53 (!) 87/54 (!) 129/101  Pulse: 83 76 82 (!) 102  Resp: 17 19 20 20   Temp:  98.5 F (36.9 C) (!) 97.5 F (36.4 C) (!) 97.4 F (36.3 C) 98.2 F (36.8 C)  TempSrc: Oral  Oral   SpO2: 97% 93%  (!) 89%  Weight:      Height:        Intake/Output Summary (Last 24 hours) at 03/25/2020 1315 Last data filed at 03/25/2020 0431 Gross per 24 hour  Intake 1717.55 ml  Output -  Net 1717.55 ml   Filed  Weights   03/23/20 0626  Weight: 52.2 kg    Examination:  General exam: Appears calm and comfortable  Respiratory system: Clear to auscultation. Respiratory effort normal. Cardiovascular system: S1 & S2 heard, RRR. No JVD, murmurs, rubs, gallops or clicks. No pedal edema. Gastrointestinal system: Abdomen is nondistended, soft and nontender. No organomegaly or masses felt. Normal bowel sounds heard. Central nervous system: Alert and oriented. No focal neurological deficits. Extremities: Left lower extremity in surgical dressing.  Not removed.  Mild tender to palpation.  Good pedal pulses  skin: No rashes, lesions or ulcers Psychiatry: Judgement and insight appear normal. Mood & affect appropriate.     Data Reviewed: I have personally reviewed following labs and imaging studies  CBC: Recent Labs  Lab 03/23/20 0629 03/24/20 0546 03/25/20 1119  WBC 5.9 5.7 5.5  NEUTROABS 4.1  --   --   HGB 10.3* 7.6* 6.1*  HCT 33.5* 23.6* 19.8*  MCV 96.0 94.0 95.7  PLT 171 162 883   Basic Metabolic Panel: Recent Labs  Lab 03/23/20 0629 03/24/20 0546 03/25/20 1119  NA 141 139 140  K 4.0 4.0 3.6  CL 109 108 111  CO2 26 23 21*  GLUCOSE 103* 118* 109*  BUN 22 23 28*  CREATININE 0.83 0.85 0.94  CALCIUM 8.9 8.0* 8.0*   GFR: Estimated Creatinine Clearance: 37.4 mL/min (by C-G formula based on SCr of 0.94 mg/dL). Liver Function Tests: No results for input(s): AST, ALT, ALKPHOS, BILITOT, PROT, ALBUMIN in the last 168 hours. No results for input(s): LIPASE, AMYLASE in the last 168 hours. No results for input(s): AMMONIA in the last 168 hours. Coagulation Profile: Recent Labs  Lab 03/23/20 0917  INR 1.0   Cardiac Enzymes: No results for input(s): CKTOTAL, CKMB, CKMBINDEX, TROPONINI in the last 168 hours. BNP (last 3 results) No results for input(s): PROBNP in the last 8760 hours. HbA1C: No results for input(s): HGBA1C in the last 72 hours. CBG: No results for input(s): GLUCAP in  the last 168 hours. Lipid Profile: No results for input(s): CHOL, HDL, LDLCALC, TRIG, CHOLHDL, LDLDIRECT in the last 72 hours. Thyroid Function Tests: No results for input(s): TSH, T4TOTAL, FREET4, T3FREE, THYROIDAB in the last 72 hours. Anemia Panel: No results for input(s): VITAMINB12, FOLATE, FERRITIN, TIBC, IRON, RETICCTPCT in the last 72 hours. Sepsis Labs: No results for input(s): PROCALCITON, LATICACIDVEN in the last 168 hours.  Recent Results (from the past 240 hour(s))  Resp Panel by RT-PCR (Flu A&B, Covid) Nasopharyngeal Swab     Status: None   Collection Time: 03/23/20  8:40 AM   Specimen: Nasopharyngeal Swab; Nasopharyngeal(NP) swabs in vial transport medium  Result Value Ref Range Status   SARS Coronavirus 2 by RT PCR NEGATIVE NEGATIVE Final    Comment: (NOTE) SARS-CoV-2 target nucleic acids are NOT DETECTED.  The SARS-CoV-2 RNA is generally detectable in upper respiratory specimens during the acute phase of infection. The lowest concentration of SARS-CoV-2 viral copies this assay can detect is 138 copies/mL. A negative result does not preclude SARS-Cov-2 infection  and should not be used as the sole basis for treatment or other patient management decisions. A negative result may occur with  improper specimen collection/handling, submission of specimen other than nasopharyngeal swab, presence of viral mutation(s) within the areas targeted by this assay, and inadequate number of viral copies(<138 copies/mL). A negative result must be combined with clinical observations, patient history, and epidemiological information. The expected result is Negative.  Fact Sheet for Patients:  EntrepreneurPulse.com.au  Fact Sheet for Healthcare Providers:  IncredibleEmployment.be  This test is no t yet approved or cleared by the [place] FDA and  has been authorized for detection and/or diagnosis of SARS-CoV-2 by FDA under an Emergency Use  Authorization (EUA). This EUA will remain  in effect (meaning this test can be used) for the duration of the COVID-19 declaration under Section 564(b)(1) of the Act, 21 U.S.C.section 360bbb-3(b)(1), unless the authorization is terminated  or revoked sooner.       Influenza A by PCR NEGATIVE NEGATIVE Final   Influenza B by PCR NEGATIVE NEGATIVE Final    Comment: (NOTE) The Xpert Xpress SARS-CoV-2/FLU/RSV plus assay is intended as an aid in the diagnosis of influenza from Nasopharyngeal swab specimens and should not be used as a sole basis for treatment. Nasal washings and aspirates are unacceptable for Xpert Xpress SARS-CoV-2/FLU/RSV testing.  Fact Sheet for Patients: EntrepreneurPulse.com.au  Fact Sheet for Healthcare Providers: IncredibleEmployment.be  This test is not yet approved or cleared by the [place] FDA and has been authorized for detection and/or diagnosis of SARS-CoV-2 by FDA under an Emergency Use Authorization (EUA). This EUA will remain in effect (meaning this test can be used) for the duration of the COVID-19 declaration under Section 564(b)(1) of the Act, 21 U.S.C. section 360bbb-3(b)(1), unless the authorization is terminated or revoked.  Performed at [facility], [address] [place], [place] [address]          Radiology Studies: DG HIP OPERATIVE UNILAT W OR W/O PELVIS LEFT  Result Date: 03/23/2020 CLINICAL DATA:  Surgery EXAM: OPERATIVE left HIP (WITH PELVIS IF PERFORMED) 4 VIEWS TECHNIQUE: Fluoroscopic spot image(s) were submitted for interpretation post-operatively. COMPARISON:  03/23/2020 FINDINGS: Four low resolution intraoperative spot views of the left hip. Total fluoroscopy time was 5 minutes 42 seconds. The images demonstrate intramedullary rodding and distal screw fixation of left femur for intertrochanteric hip fracture. IMPRESSION: Intraoperative fluoroscopic assistance provided during left  hip surgery. Electronically Signed   By: Donavan Foil M.D.   On: 03/23/2020 17:13        Scheduled Meds: . acetaminophen  1,000 mg Oral Q8H  . Chlorhexidine Gluconate Cloth  6 each Topical Daily  . docusate sodium  100 mg Oral BID  . enoxaparin (LOVENOX) injection  40 mg Subcutaneous Q24H  . feeding supplement  237 mL Oral BID BM  . lisinopril  20 mg Oral Daily   Or  . hydrochlorothiazide  25 mg Oral Daily  . melatonin  3 mg Oral QHS  . multivitamin with minerals  1 tablet Oral Daily   Continuous Infusions: . sodium chloride 75 mL/hr at 03/25/20 0515     LOS: 2 days    Time spent: 25 minutes    Sidney Ace, MD [facility] Pager [phone]  If 7PM-7AM, please contact night-coverage 03/25/2020, 1:15 PM

## 2020-03-25 NOTE — Progress Notes (Signed)
PT Cancellation Note  Patient Details Name: Laura Michael MRN: [id number] DOB: May 25, 1934   Cancelled Treatment:    Reason Eval/Treat Not Completed: Fatigue/lethargy limiting ability to participate;Other (comment) (Pt refused to participate with therapy today quoted "why don't yall just leave me alone") Will check back with patient tomorrow.   Isaias Cowman 03/25/2020, 10:49 AM

## 2020-03-25 NOTE — Progress Notes (Signed)
Patient alert and confused. Sitter at bedside, son at bedside. Patient reports pain is managed with ice pack and Voltaren gel as well as routine tylenol. Patient tolerating meds, meals, and turn & repositioning. Comfort and hygiene measures provided, q4 vitals and q1 intentional rounding/safety checks completed and hygiene assistance provided as needed. Plan of care reviewed with pt and family with agreement. Education provided with verbalized understanding, however needs reinformcement. Personal items within reach. Bed in lowest position. Fresh water kept at bedside as well. Call light within reach. Will continue to monitor and endorse.

## 2020-03-25 NOTE — Progress Notes (Signed)
Subjective: 2 Days Post-Op Procedure(s) (LRB): INTRAMEDULLARY (IM) NAIL INTERTROCHANTRIC (Left) Patient does not give me a pain score this morning. Patient began to sun-down yesterday evening, removing her IV and refused her blood draw this AM. She wants me to "leave her alone" this morning. Plan is for discharge to SNF when appropriate. Negative for chest pain and shortness of breath Fever: no Gastrointestinal:Negative for nausea and vomiting  Objective: Vital signs in last 24 hours: Temp:  [97.4 F (36.3 C)-98.2 F (36.8 C)] 98.2 F (36.8 C) (02/26 1933) Pulse Rate:  [76-102] 102 (02/26 1933) Resp:  [19-20] 20 (02/26 1933) BP: (85-129)/(53-101) 129/101 (02/26 1933) SpO2:  [89 %-93 %] 89 % (02/26 1933)  Intake/Output from previous day:  Intake/Output Summary (Last 24 hours) at 03/25/2020 0841 Last data filed at 03/25/2020 0431 Gross per 24 hour  Intake 1867.55 ml  Output --  Net 1867.55 ml    Intake/Output this shift: No intake/output data recorded.  Labs: Recent Labs    03/23/20 0629 03/24/20 0546  HGB 10.3* 7.6*   Recent Labs    03/23/20 0629 03/24/20 0546  WBC 5.9 5.7  RBC 3.49* 2.51*  HCT 33.5* 23.6*  PLT 171 162   Recent Labs    03/23/20 0629 03/24/20 0546  NA 141 139  K 4.0 4.0  CL 109 108  CO2 26 23  BUN 22 23  CREATININE 0.83 0.85  GLUCOSE 103* 118*  CALCIUM 8.9 8.0*   Recent Labs    03/23/20 0917  INR 1.0   EXAM General - Patient is Disorganized and Confused Extremity - Unable to perform full exam due to the patient pushing me away and refusing to be examined. Bulky dressing applied to the left leg without noticeable drainage. Motor Function - intact, moving foot and toes well on exam.  Past Medical History:  Diagnosis Date  . HTN (hypertension)     Assessment/Plan: 2 Days Post-Op Procedure(s) (LRB): INTRAMEDULLARY (IM) NAIL INTERTROCHANTRIC (Left) Active Problems:   Closed left hip fracture (HCC)   HTN (hypertension)    Fall   Normocytic anemia  Estimated body mass index is 20.37 kg/m as calculated from the following:   Height as of this encounter: 5\' 3"  (1.6 m).   Weight as of this encounter: 52.2 kg. Advance diet Up with therapy D/C IV fluids when tolerating po intake.  Labs unable to be drawn this morning. Patient with increased confusion today. Attempt to have her work with PT if able. Avoid narcotics today and continue to monitor. Plan will be for discharge to SNF when appropriate.  DVT Prophylaxis - Lovenox and SCDs Toe-touch weightbearing to the left leg.  Raquel Susana Duell, PA-C [facility] Orthopaedic Surgery 03/25/2020, 8:41 AM

## 2020-03-25 NOTE — Plan of Care (Signed)
Pt combative and confused with staff and son who is present in room .  Pt hitting staff when attempting to do vitals signs, lab draws, etc

## 2020-03-26 ENCOUNTER — Encounter: Payer: Self-pay | Admitting: Orthopedic Surgery

## 2020-03-26 LAB — CBC WITH DIFFERENTIAL/PLATELET
Abs Immature Granulocytes: 0.03 10*3/uL (ref 0.00–0.07)
Basophils Absolute: 0 10*3/uL (ref 0.0–0.1)
Basophils Relative: 0 %
Eosinophils Absolute: 0 10*3/uL (ref 0.0–0.5)
Eosinophils Relative: 0 %
HCT: 23.8 % — ABNORMAL LOW (ref 36.0–46.0)
Hemoglobin: 7.6 g/dL — ABNORMAL LOW (ref 12.0–15.0)
Immature Granulocytes: 1 %
Lymphocytes Relative: 13 %
Lymphs Abs: 0.7 10*3/uL (ref 0.7–4.0)
MCH: 29.9 pg (ref 26.0–34.0)
MCHC: 31.9 g/dL (ref 30.0–36.0)
MCV: 93.7 fL (ref 80.0–100.0)
Monocytes Absolute: 0.4 10*3/uL (ref 0.1–1.0)
Monocytes Relative: 8 %
Neutro Abs: 4.4 10*3/uL (ref 1.7–7.7)
Neutrophils Relative %: 78 %
Platelets: 146 10*3/uL — ABNORMAL LOW (ref 150–400)
RBC: 2.54 MIL/uL — ABNORMAL LOW (ref 3.87–5.11)
RDW: 16.6 % — ABNORMAL HIGH (ref 11.5–15.5)
WBC: 5.6 10*3/uL (ref 4.0–10.5)
nRBC: 0 % (ref 0.0–0.2)

## 2020-03-26 LAB — BASIC METABOLIC PANEL
Anion gap: 7 (ref 5–15)
BUN: 22 mg/dL (ref 8–23)
CO2: 22 mmol/L (ref 22–32)
Calcium: 8.1 mg/dL — ABNORMAL LOW (ref 8.9–10.3)
Chloride: 113 mmol/L — ABNORMAL HIGH (ref 98–111)
Creatinine, Ser: 0.8 mg/dL (ref 0.44–1.00)
GFR, Estimated: >60 mL/min (ref >60)
Glucose, Bld: 115 mg/dL — ABNORMAL HIGH (ref 70–99)
Potassium: 3.5 mmol/L (ref 3.5–5.1)
Sodium: 142 mmol/L (ref 135–145)

## 2020-03-26 MED ORDER — ENOXAPARIN SODIUM 40 MG/0.4ML ~~LOC~~ SOLN: 40.0000 mg | SUBCUTANEOUS | 0 refills | Status: DC

## 2020-03-26 MED ORDER — OXYCODONE HCL 5 MG PO TABS: 2.5000 mg | ORAL_TABLET | Freq: Four times a day (QID) | ORAL | 0 refills | Status: DC | PRN

## 2020-03-26 NOTE — NC FL2 (Signed)
[place] LEVEL OF CARE SCREENING TOOL     IDENTIFICATION  Patient Name: Laura Michael Birthdate: 1934/03/12 Sex: female Admission Date (Current Location): 03/23/2020  [place] and [place] Number:  Engineering geologist and Address:  [facility], [address], [place], [place] [address]      Provider Number: [id number]  Attending Physician Name and Address:  Sidney Ace, MD  Relative Name and Phone Number:  Niani Mourer [phone]    Current Level of Care: Hospital Recommended Level of Care: [place] Prior Approval Number:    Date Approved/Denied:   PASRR Number: [id number]  Discharge Plan: SNF    Current Diagnoses: Patient Active Problem List   Diagnosis Date Noted  . Closed left hip fracture ([place]) 03/23/2020  . Fall 03/23/2020  . Normocytic anemia 03/23/2020  . HTN (hypertension)     Orientation RESPIRATION BLADDER Height & Weight     Self,Situation,Time,Place  Normal External catheter Weight: 52.2 kg Height:  5\' 3"  (160 cm)  BEHAVIORAL SYMPTOMS/MOOD NEUROLOGICAL BOWEL NUTRITION STATUS      Continent Diet (Regular)  AMBULATORY STATUS COMMUNICATION OF NEEDS Skin   Extensive Assist Verbally Surgical wounds                       Personal Care Assistance Level of Assistance  Bathing,Feeding,Dressing Bathing Assistance: Maximum assistance Feeding assistance: Limited assistance Dressing Assistance: Maximum assistance     Functional Limitations Info  Sight,Hearing,Speech Sight Info: Adequate Hearing Info: Adequate Speech Info: Adequate    SPECIAL CARE FACTORS FREQUENCY  PT (By licensed PT),OT (By licensed OT)                    Contractures Contractures Info: Present    Additional Factors Info  Code Status,Allergies Code Status Info: DNR Allergies Info: No known allergies           Current Medications (03/26/2020):  This is the current hospital active medication  list Current Facility-Administered Medications  Medication Dose Route Frequency Provider Last Rate Last Admin  . 0.9 %  sodium chloride infusion   Intravenous Continuous Leim Fabry, MD   Held at 03/26/20 [phone]  . acetaminophen (TYLENOL) tablet 1,000 mg  1,000 mg Oral Q8H Leim Fabry, MD   1,000 mg at 03/25/20 2212  . acetaminophen (TYLENOL) tablet 650 mg  650 mg Oral Q6H PRN Ralene Muskrat B, MD      . bisacodyl (DULCOLAX) suppository 10 mg  10 mg Rectal Daily PRN Leim Fabry, MD      . Chlorhexidine Gluconate Cloth 2 % PADS 6 each  6 each Topical Daily Ivor Costa, MD   6 each at 03/24/20 [phone]  . diclofenac Sodium (VOLTAREN) 1 % topical gel 2 g  2 g Topical QID Ralene Muskrat B, MD   2 g at 03/26/20 0912  . docusate sodium (COLACE) capsule 100 mg  100 mg Oral BID Leim Fabry, MD   100 mg at 03/26/20 0909  . feeding supplement (ENSURE ENLIVE / ENSURE PLUS) liquid 237 mL  237 mL Oral BID BM Ivor Costa, MD   237 mL at 03/25/20 1721  . haloperidol (HALDOL) tablet 2 mg  2 mg Oral Q6H PRN Ralene Muskrat B, MD       Or  . haloperidol lactate (HALDOL) injection 2 mg  2 mg Intramuscular Q6H PRN Sreenath, Sudheer B, MD      . hydrALAZINE (APRESOLINE) injection 5 mg  5  mg Intravenous Q2H PRN Ivor Costa, MD      . lisinopril (ZESTRIL) tablet 20 mg  20 mg Oral Daily Benn Moulder, RPH       Or  . hydrochlorothiazide (HYDRODIURIL) tablet 25 mg  25 mg Oral Daily Deatra Robinson B, RPH   25 mg at 03/26/20 3500  . melatonin tablet 3 mg  3 mg Oral QHS Leim Fabry, MD   3 mg at 03/25/20 2212  . metoCLOPramide (REGLAN) tablet 5-10 mg  5-10 mg Oral Q8H PRN Leim Fabry, MD       Or  . metoCLOPramide (REGLAN) injection 5-10 mg  5-10 mg Intravenous Q8H PRN Leim Fabry, MD      . multivitamin with minerals tablet 1 tablet  1 tablet Oral Daily Ivor Costa, MD   1 tablet at 03/26/20 0909  . ondansetron (ZOFRAN) tablet 4 mg  4 mg Oral Q6H PRN Leim Fabry, MD   4 mg at 03/23/20 1813   Or  .  ondansetron [facility]) injection 4 mg  4 mg Intravenous Q6H PRN Leim Fabry, MD      . oxyCODONE (Oxy IR/ROXICODONE) immediate release tablet 5-10 mg  5-10 mg Oral Q4H PRN Ralene Muskrat B, MD   5 mg at 03/26/20 0909  . senna-docusate (Senokot-S) tablet 1 tablet  1 tablet Oral QHS PRN Leim Fabry, MD      . sodium phosphate (FLEET) 7-19 GM/118ML enema 1 enema  1 enema Rectal Once PRN Leim Fabry, MD         Discharge Medications: Please see discharge summary for a list of discharge medications.  Relevant Imaging Results:  Relevant Lab Results:   Additional Information SS# [id number]  Shelbie Ammons, RN

## 2020-03-26 NOTE — Progress Notes (Signed)
Physical Therapy Treatment Patient Details Name: Laura Michael MRN: [id number] DOB: Oct 27, 1934 Today's Date: 03/26/2020    History of Present Illness Pt is an 86 y.o. female with medical history significant of anemia, hypertension, who presents with fall and left hip and knee pain. Pt diagnosed with left reverse obliquity hip fracture and is s/p intramedullary nailing of L femur with cephalomedullary device.    PT Comments    Pt was pleasant and motivated to participate during the session but declined sitting up to EOB secondary to LLE pain this PM that she attributed to mobility training during the AM session.  Pt put forth good effort during the below therapeutic exercises without adverse symptoms other than LLE pain that pt reported as 8/10, nursing aware and pain medication requested.  Pt will benefit from PT services in a SNF setting upon discharge to safely address deficits listed in patient problem list for decreased caregiver assistance and eventual return to PLOF.      Follow Up Recommendations  SNF     Equipment Recommendations  None recommended by PT    Recommendations for Other Services       Precautions / Restrictions Precautions Precautions: Fall Precaution Comments: LLE Foot Flat touchdown weightbearing Restrictions Weight Bearing Restrictions: Yes LLE Weight Bearing: Touchdown weight bearing Other Position/Activity Restrictions: TDWB on the LLE in foot flat position    Mobility  Bed Mobility               General bed mobility comments: Pt declined bed mobility/OOB this session secondary to LLE pain    Transfers                    Ambulation/Gait                 Stairs             Wheelchair Mobility    Modified Rankin (Stroke Patients Only)       Balance       Sitting balance - Comments: Pt with good seated balance sitting EOB with BIL                                    Cognition  Arousal/Alertness: Awake/alert Behavior During Therapy: WFL for tasks assessed/performed Overall Cognitive Status: Within Functional Limits for tasks assessed                                        Exercises Total Joint Exercises Ankle Circles/Pumps: AROM;Both;15 reps;Strengthening;10 reps (with manual resistance) Quad Sets: Strengthening;Both;15 reps;10 reps Gluteal Sets: 10 reps;15 reps;Both;Strengthening Towel Squeeze: Strengthening;Both;10 reps;15 reps Short Arc Quad: Strengthening;AROM;Both;10 reps;15 reps (with manual resistance on the RLE) Heel Slides: Strengthening;AAROM;Both;10 reps;15 reps Hip ABduction/ADduction: AAROM;10 reps;Strengthening;Both;15 reps Straight Leg Raises: Strengthening;Both;AAROM;10 reps;15 reps Bridges: AROM;Right;5 reps (min amplitude on RLE only)    General Comments        Pertinent Vitals/Pain Pain Assessment: 0-10 Pain Score: 8  Pain Location: left knee and hip Pain Descriptors / Indicators: Aching;Sore Pain Intervention(s): Premedicated before session;Monitored during session;Patient requesting pain meds-RN notified    Home Living                      Prior Function            PT Goals (current  goals can now be found in the care plan section) Progress towards PT goals: Progressing toward goals    Frequency    BID      PT Plan Current plan remains appropriate    Co-evaluation              AM-PAC PT "6 Clicks" Mobility   Outcome Measure  Help needed turning from your back to your side while in a flat bed without using bedrails?: A Little Help needed moving from lying on your back to sitting on the side of a flat bed without using bedrails?: A Lot Help needed moving to and from a bed to a chair (including a wheelchair)?: A Lot Help needed standing up from a chair using your arms (e.g., wheelchair or bedside chair)?: A Lot Help needed to walk in hospital room?: Total Help needed climbing 3-5 steps  with a railing? : Total 6 Click Score: 11    End of Session   Activity Tolerance: Patient tolerated treatment well Patient left: in bed;with call bell/phone within reach;with bed alarm set;with family/visitor present Nurse Communication: Mobility status PT Visit Diagnosis: Unsteadiness on feet (R26.81);Muscle weakness (generalized) (M62.81);Difficulty in walking, not elsewhere classified (R26.2)     Time: 5625-6389 PT Time Calculation (min) (ACUTE ONLY): 24 min  Charges:  $Therapeutic Exercise: 23-37 mins                     D. Royetta Asal PT, DPT 03/26/20, 3:49 PM

## 2020-03-26 NOTE — Progress Notes (Signed)
Physical Therapy Treatment Patient Details Name: Laura Michael MRN: [id number] DOB: 10/10/1934 Today's Date: 03/26/2020    History of Present Illness Laura Michael is a 86 y.o. female with medical history significant of anemia, hypertension, who presents with fall and left hip and knee pain. Pt diagnosed with left reverse obliquity hip fracture and is s/p intramedullary nailing of L femur with cephalomedullary device, pt is touchdown WB on LLE    PT Comments    Pt was pleasant and motivated to participate during the session.  Pt put forth very good effort with below therex and reported no increase in LLE pain during the session.  Pt required significant assistance with functional tasks and once in standing was only able to take several shuffling steps at the EOB in order to maintain WB compliance to the LLE.  Pt will benefit from PT services in a SNF setting upon discharge to safely address deficits listed in patient problem list for decreased caregiver assistance and eventual return to PLOF.     Follow Up Recommendations  SNF     Equipment Recommendations  None recommended by PT    Recommendations for Other Services       Precautions / Restrictions Precautions Precautions: Fall Precaution Comments: LLE Foot Flat touchdown weightbearing Restrictions Weight Bearing Restrictions: Yes LLE Weight Bearing: Touchdown weight bearing Other Position/Activity Restrictions: TDWB on the LLE in foot flat position    Mobility  Bed Mobility Overal bed mobility: Needs Assistance Bed Mobility: Sit to Supine;Supine to Sit     Supine to sit: Mod assist Sit to supine: Mod assist   General bed mobility comments: Mod assist with LLE and trunk control    Transfers Overall transfer level: Needs assistance Equipment used: Rolling walker (2 wheeled) Transfers: Sit to/from Stand Sit to Stand: Mod assist         General transfer comment: Mod A and mod verbal cues for sequencing for  WB compliance  Ambulation/Gait Ambulation/Gait assistance: Mod assist Gait Distance (Feet): 1 Feet Assistive device: Rolling walker (2 wheeled) Gait Pattern/deviations: Trunk flexed;Shuffle Gait velocity: decreased   General Gait Details: Pt only able to shuffle her R foot on the floor during ambulation attempts but unable to clear the R foot from the floor and still maintain LLE WB status   Stairs             Wheelchair Mobility    Modified Rankin (Stroke Patients Only)       Balance Overall balance assessment: Needs assistance   Sitting balance-Leahy Scale: Good     Standing balance support: Bilateral upper extremity supported;During functional activity Standing balance-Leahy Scale: Fair Standing balance comment: Mod lean on the RW for support                            Cognition Arousal/Alertness: Awake/alert Behavior During Therapy: WFL for tasks assessed/performed Overall Cognitive Status: Within Functional Limits for tasks assessed                                        Exercises Total Joint Exercises Ankle Circles/Pumps: AROM;Both;15 reps;Strengthening;10 reps (with manual resistance) Quad Sets: Strengthening;Both;15 reps;10 reps Gluteal Sets: 10 reps;15 reps;Both;Strengthening Towel Squeeze: Strengthening;Both;10 reps;15 reps Short Arc Quad: Strengthening;AROM;Both;10 reps;15 reps (with manual resistance on the RLE) Heel Slides: AROM;Strengthening;AAROM;Both;10 reps;15 reps (min amplitude and AAROM on the LLE)  Hip ABduction/ADduction: AAROM;10 reps;Strengthening;Both;15 reps (min amplitude and AAROM on the LLE) Straight Leg Raises: Strengthening;Both;AAROM;10 reps;15 reps (min amplitude and AAROM on the LLE) Long Arc Quad: AROM;10 reps;Strengthening;15 reps;Both Knee Flexion: AROM;Strengthening;Both;10 reps;15 reps Other Exercises Other Exercises: Static standing at EOB with cues for WB compliance 2 x 30 sec    General  Comments        Pertinent Vitals/Pain Pain Assessment: 0-10 Pain Score: 8  Pain Location: left knee and hip Pain Descriptors / Indicators: Aching Pain Intervention(s): Premedicated before session;Monitored during session    Home Living                      Prior Function            PT Goals (current goals can now be found in the care plan section) Progress towards PT goals: Not progressing toward goals - comment (limited by weakness and WB status)    Frequency    BID      PT Plan Current plan remains appropriate    Co-evaluation              AM-PAC PT "6 Clicks" Mobility   Outcome Measure  Help needed turning from your back to your side while in a flat bed without using bedrails?: A Little Help needed moving from lying on your back to sitting on the side of a flat bed without using bedrails?: A Lot Help needed moving to and from a bed to a chair (including a wheelchair)?: A Lot Help needed standing up from a chair using your arms (e.g., wheelchair or bedside chair)?: A Lot Help needed to walk in hospital room?: Total Help needed climbing 3-5 steps with a railing? : Total 6 Click Score: 11    End of Session Equipment Utilized During Treatment: Gait belt Activity Tolerance: Patient tolerated treatment well Patient left: in bed;with call bell/phone within reach;with bed alarm set;with family/visitor present Nurse Communication: Mobility status PT Visit Diagnosis: Unsteadiness on feet (R26.81);Muscle weakness (generalized) (M62.81);Difficulty in walking, not elsewhere classified (R26.2)     Time: 6767-2094 PT Time Calculation (min) (ACUTE ONLY): 40 min  Charges:  $Therapeutic Exercise: 23-37 mins $Therapeutic Activity: 8-22 mins                     D. Scott Brinklee Cisse PT, DPT 03/26/20, 11:04 AM

## 2020-03-26 NOTE — NC FL2 (Signed)
[facility] LEVEL OF CARE SCREENING TOOL     IDENTIFICATION  Patient Name: Laura Michael Birthdate: 1934/07/27 Sex: female Admission Date (Current Location): 03/23/2020  [place] and [place] Number:  Engineering geologist and Address:  [facility], [address] [place], [place] [address]      Provider Number: [id number]  Attending Physician Name and Address:  Sidney Ace, MD  Relative Name and Phone Number:  Markeita Alicia [phone]    Current Level of Care: Hospital Recommended Level of Care: Advance Prior Approval Number:    Date Approved/Denied:   PASRR Number: [id number]  Discharge Plan: SNF    Current Diagnoses: Patient Active Problem List   Diagnosis Date Noted  . Closed left hip fracture ([place]) 03/23/2020  . Fall 03/23/2020  . Normocytic anemia 03/23/2020  . HTN (hypertension)     Orientation RESPIRATION BLADDER Height & Weight     Self,Situation,Time,Place  Normal External catheter Weight: 52.2 kg Height:  5\' 3"  (160 cm)  BEHAVIORAL SYMPTOMS/MOOD NEUROLOGICAL BOWEL NUTRITION STATUS      Continent Diet (Regular)  AMBULATORY STATUS COMMUNICATION OF NEEDS Skin   Extensive Assist Verbally Surgical wounds                       Personal Care Assistance Level of Assistance  Bathing,Feeding,Dressing Bathing Assistance: Maximum assistance Feeding assistance: Limited assistance Dressing Assistance: Maximum assistance     Functional Limitations Info  Sight,Hearing,Speech Sight Info: Adequate Hearing Info: Adequate Speech Info: Adequate    SPECIAL CARE FACTORS FREQUENCY  PT (By licensed PT),OT (By licensed OT)                    Contractures Contractures Info: Present    Additional Factors Info  Code Status,Allergies Code Status Info: DNR Allergies Info: No known allergies           Current Medications (03/26/2020):  This is the current hospital active medication  list Current Facility-Administered Medications  Medication Dose Route Frequency Provider Last Rate Last Admin  . 0.9 %  sodium chloride infusion   Intravenous Continuous Leim Fabry, MD   Held at 03/26/20 [phone]  . acetaminophen (TYLENOL) tablet 1,000 mg  1,000 mg Oral Q8H Leim Fabry, MD   1,000 mg at 03/25/20 2212  . acetaminophen (TYLENOL) tablet 650 mg  650 mg Oral Q6H PRN Ralene Muskrat B, MD      . bisacodyl (DULCOLAX) suppository 10 mg  10 mg Rectal Daily PRN Leim Fabry, MD      . Chlorhexidine Gluconate Cloth 2 % PADS 6 each  6 each Topical Daily Ivor Costa, MD   6 each at 03/24/20 [phone]  . diclofenac Sodium (VOLTAREN) 1 % topical gel 2 g  2 g Topical QID Ralene Muskrat B, MD   2 g at 03/26/20 0912  . docusate sodium (COLACE) capsule 100 mg  100 mg Oral BID Leim Fabry, MD   100 mg at 03/26/20 0909  . feeding supplement (ENSURE ENLIVE / ENSURE PLUS) liquid 237 mL  237 mL Oral BID BM Ivor Costa, MD   237 mL at 03/25/20 1721  . haloperidol (HALDOL) tablet 2 mg  2 mg Oral Q6H PRN Ralene Muskrat B, MD       Or  . haloperidol lactate (HALDOL) injection 2 mg  2 mg Intramuscular Q6H PRN Sreenath, Sudheer B, MD      . hydrALAZINE (APRESOLINE) injection 5 mg  5  mg Intravenous Q2H PRN Ivor Costa, MD      . lisinopril (ZESTRIL) tablet 20 mg  20 mg Oral Daily Benn Moulder, RPH       Or  . hydrochlorothiazide (HYDRODIURIL) tablet 25 mg  25 mg Oral Daily Deatra Robinson B, RPH   25 mg at 03/26/20 6962  . melatonin tablet 3 mg  3 mg Oral QHS Leim Fabry, MD   3 mg at 03/25/20 2212  . metoCLOPramide (REGLAN) tablet 5-10 mg  5-10 mg Oral Q8H PRN Leim Fabry, MD       Or  . metoCLOPramide (REGLAN) injection 5-10 mg  5-10 mg Intravenous Q8H PRN Leim Fabry, MD      . multivitamin with minerals tablet 1 tablet  1 tablet Oral Daily Ivor Costa, MD   1 tablet at 03/26/20 0909  . ondansetron (ZOFRAN) tablet 4 mg  4 mg Oral Q6H PRN Leim Fabry, MD   4 mg at 03/23/20 1813   Or  .  ondansetron [facility]) injection 4 mg  4 mg Intravenous Q6H PRN Leim Fabry, MD      . oxyCODONE (Oxy IR/ROXICODONE) immediate release tablet 5-10 mg  5-10 mg Oral Q4H PRN Ralene Muskrat B, MD   5 mg at 03/26/20 0909  . senna-docusate (Senokot-S) tablet 1 tablet  1 tablet Oral QHS PRN Leim Fabry, MD      . sodium phosphate (FLEET) 7-19 GM/118ML enema 1 enema  1 enema Rectal Once PRN Leim Fabry, MD         Discharge Medications: Please see discharge summary for a list of discharge medications.  Relevant Imaging Results:  Relevant Lab Results:   Additional Information SS# [id number]  Shelbie Ammons, RN

## 2020-03-26 NOTE — Progress Notes (Signed)
PROGRESS NOTE    Laura Michael  JHE:[id number] DOB: [birth year] DOA: 03/23/2020 PCP: System, Provider Not In  Brief Narrative:  86 y.o. female with medical history significant of anemia, hypertension, who presents with fall and left hip and knee pain.  Pt states that she fell accidentally landing on her left side at about 4:00 AM when she was walking to the bathroom. She injured her left knee and hip. No LOC. No head or neck injury. She developed pain in left knee and left hip, which is constant, sharp, moderate to severe, nonradiating. States she cannot move the leg due to pain  X-ray of left hip/pelvis showed displaced left hip intertrochanteric fracture. X-ray of the left knee is negative for fracture. Patient is admitted to [place] bed as inpatient. Dr. Posey Pronto of Ortho is consulted.  Status post IM nail left femur on 2/25.  Tolerated procedure well with no complications.  Has had some postoperative delirium worse on the evening of 2/26.  Received 2 doses of IV Haldol overnight.   Delirium much improved.  Patient stable, calm, mentating clearly on the morning of 2/28  Assessment & Plan:   Active Problems:   Closed left hip fracture (HCC)   HTN (hypertension)   Fall   Normocytic anemia  Hospital-acquired postoperative delirium, improved Patient became agitated on 2/26 evening Combative and confused, attempting to get out of bed Removed her IV line and refusing lab draws As of 2/28 this is resolved Plan: Nonpharmacologic methods Delirium precautions Frequent reorientation As needed Haldol IM/p.o. ordered for agitation not responding to nonpharmacologic methods Continue safety sitter  Acute on chronic postoperative blood loss anemia  Hemoglobin 10.3 on arrival No previous baseline hemoglobin Hemoglobin drifting down 2/27, hemoglobin 6.1 2/28, hemoglobin 7.6 Plan: No further transfusion Hold chemoprophylaxis today, place SCDs Restart chemoprophylaxis if hemoglobin  stable on 3/1  Closed left hip fracture (HCC)  X-ray showed acute angulated displaced left hip intertrochanteric fracture.  Status post IM nail with orthopedics 2/25 Tolerated procedure well Postoperative pain mild Plan: Multimodal pain control Avoid IV narcotics and sedatives if able Continue therapy evaluations as tolerated TOC consult for skilled nursing facility placement   HTN (hypertension) -Prinzide -IV prn hydralazine  Fall Therapy evaluations TOC consult for SNF placement     DVT prophylaxis: SQ Lovenox Code Status: Full Family Communication: Son at bedside, 2/28 disposition Plan: Status is: Inpatient  Remains inpatient appropriate because:Altered mental status and Inpatient level of care appropriate due to severity of illness   Dispo: The patient is from: Home              Anticipated d/c is to: SNF              Patient currently is not medically stable to d/c.   Difficult to place patient No   Patient recovering from surgery well.  Postoperative anemia noted.  If hemoglobin stable will restart chemoprophylaxis and patient medically ready for discharge on 3/1.           Level of care: Med-Surg  Consultants:   Orthopedics  Procedures:   IM nail, 2/25  Antimicrobials:   None   Subjective: Patient seen and examined.  Sitting up in bed.  Mentating clearly.  Son at bedside.  Objective: Vitals:   03/26/20 0328 03/26/20 0630 03/26/20 0738 03/26/20 1146  BP: (!) 108/56 121/62 120/63 113/60  Pulse: 84 74 77 89  Resp: 19 16 17 18   Temp: 98.4 F (36.9 C) 97.8 F (36.6 C) 98.3  F (36.8 C) 98.7 F (37.1 C)  TempSrc: Oral Oral    SpO2: 99% 99% 98% 99%  Weight:      Height:        Intake/Output Summary (Last 24 hours) at 03/26/2020 1333 Last data filed at 03/26/2020 1041 Gross per 24 hour  Intake 1909.57 ml  Output -  Net 1909.57 ml   Filed Weights   03/23/20 0626  Weight: 52.2 kg    Examination:  General exam: Appears calm  and comfortable  Respiratory system: Clear to auscultation. Respiratory effort normal. Cardiovascular system: S1 & S2 heard, RRR. No JVD, murmurs, rubs, gallops or clicks. No pedal edema. Gastrointestinal system: Abdomen is nondistended, soft and nontender. No organomegaly or masses felt. Normal bowel sounds heard. Central nervous system: Alert and oriented. No focal neurological deficits. Extremities: Left lower extremity in surgical dressing.  Not removed.  Mild tender to palpation.  Good pedal pulses  skin: No rashes, lesions or ulcers Psychiatry: Judgement and insight appear normal. Mood & affect appropriate.     Data Reviewed: I have personally reviewed following labs and imaging studies  CBC: Recent Labs  Lab 03/23/20 0629 03/24/20 0546 03/25/20 1119 03/26/20 1013  WBC 5.9 5.7 5.5 5.6  NEUTROABS 4.1  --   --  4.4  HGB 10.3* 7.6* 6.1* 7.6*  HCT 33.5* 23.6* 19.8* 23.8*  MCV 96.0 94.0 95.7 93.7  PLT 171 162 152 371*   Basic Metabolic Panel: Recent Labs  Lab 03/23/20 0629 03/24/20 0546 03/25/20 1119 03/26/20 1013  NA 141 139 140 142  K 4.0 4.0 3.6 3.5  CL 109 108 111 113*  CO2 26 23 21* 22  GLUCOSE 103* 118* 109* 115*  BUN 22 23 28* 22  CREATININE 0.83 0.85 0.94 0.80  CALCIUM 8.9 8.0* 8.0* 8.1*   GFR: Estimated Creatinine Clearance: 43.9 mL/min (by C-G formula based on SCr of 0.8 mg/dL). Liver Function Tests: No results for input(s): AST, ALT, ALKPHOS, BILITOT, PROT, ALBUMIN in the last 168 hours. No results for input(s): LIPASE, AMYLASE in the last 168 hours. No results for input(s): AMMONIA in the last 168 hours. Coagulation Profile: Recent Labs  Lab 03/23/20 0917  INR 1.0   Cardiac Enzymes: No results for input(s): CKTOTAL, CKMB, CKMBINDEX, TROPONINI in the last 168 hours. BNP (last 3 results) No results for input(s): PROBNP in the last 8760 hours. HbA1C: No results for input(s): HGBA1C in the last 72 hours. CBG: No results for input(s): GLUCAP in  the last 168 hours. Lipid Profile: No results for input(s): CHOL, HDL, LDLCALC, TRIG, CHOLHDL, LDLDIRECT in the last 72 hours. Thyroid Function Tests: No results for input(s): TSH, T4TOTAL, FREET4, T3FREE, THYROIDAB in the last 72 hours. Anemia Panel: No results for input(s): VITAMINB12, FOLATE, FERRITIN, TIBC, IRON, RETICCTPCT in the last 72 hours. Sepsis Labs: No results for input(s): PROCALCITON, LATICACIDVEN in the last 168 hours.  Recent Results (from the past 240 hour(s))  Resp Panel by RT-PCR (Flu A&B, Covid) Nasopharyngeal Swab     Status: None   Collection Time: 03/23/20  8:40 AM   Specimen: Nasopharyngeal Swab; Nasopharyngeal(NP) swabs in vial transport medium  Result Value Ref Range Status   SARS Coronavirus 2 by RT PCR NEGATIVE NEGATIVE Final    Comment: (NOTE) SARS-CoV-2 target nucleic acids are NOT DETECTED.  The SARS-CoV-2 RNA is generally detectable in upper respiratory specimens during the acute phase of infection. The lowest concentration of SARS-CoV-2 viral copies this assay can detect is 138 copies/mL. A negative  result does not preclude SARS-Cov-2 infection and should not be used as the sole basis for treatment or other patient management decisions. A negative result may occur with  improper specimen collection/handling, submission of specimen other than nasopharyngeal swab, presence of viral mutation(s) within the areas targeted by this assay, and inadequate number of viral copies(<138 copies/mL). A negative result must be combined with clinical observations, patient history, and epidemiological information. The expected result is Negative.  Fact Sheet for Patients:  EntrepreneurPulse.com.au  Fact Sheet for Healthcare Providers:  IncredibleEmployment.be  This test is no t yet approved or cleared by the [place] FDA and  has been authorized for detection and/or diagnosis of SARS-CoV-2 by FDA under an Emergency Use  Authorization (EUA). This EUA will remain  in effect (meaning this test can be used) for the duration of the COVID-19 declaration under Section 564(b)(1) of the Act, 21 U.S.C.section 360bbb-3(b)(1), unless the authorization is terminated  or revoked sooner.       Influenza A by PCR NEGATIVE NEGATIVE Final   Influenza B by PCR NEGATIVE NEGATIVE Final    Comment: (NOTE) The Xpert Xpress SARS-CoV-2/FLU/RSV plus assay is intended as an aid in the diagnosis of influenza from Nasopharyngeal swab specimens and should not be used as a sole basis for treatment. Nasal washings and aspirates are unacceptable for Xpert Xpress SARS-CoV-2/FLU/RSV testing.  Fact Sheet for Patients: EntrepreneurPulse.com.au  Fact Sheet for Healthcare Providers: IncredibleEmployment.be  This test is not yet approved or cleared by the [place] FDA and has been authorized for detection and/or diagnosis of SARS-CoV-2 by FDA under an Emergency Use Authorization (EUA). This EUA will remain in effect (meaning this test can be used) for the duration of the COVID-19 declaration under Section 564(b)(1) of the Act, 21 U.S.C. section 360bbb-3(b)(1), unless the authorization is terminated or revoked.  Performed at [facility], [address] [place], [place] [address]          Radiology Studies: No results found.      Scheduled Meds: . acetaminophen  1,000 mg Oral Q8H  . Chlorhexidine Gluconate Cloth  6 each Topical Daily  . diclofenac Sodium  2 g Topical QID  . docusate sodium  100 mg Oral BID  . feeding supplement  237 mL Oral BID BM  . lisinopril  20 mg Oral Daily   Or  . hydrochlorothiazide  25 mg Oral Daily  . melatonin  3 mg Oral QHS  . multivitamin with minerals  1 tablet Oral Daily   Continuous Infusions: . sodium chloride Stopped (03/26/20 0337)     LOS: 3 days    Time spent: 25 minutes    Sidney Ace, MD [facility] Pager [phone]  If 7PM-7AM, please contact night-coverage 03/26/2020, 1:33 PM

## 2020-03-26 NOTE — Progress Notes (Signed)
Subjective: 3 Days Post-Op Procedure(s) (LRB): INTRAMEDULLARY (IM) NAIL INTERTROCHANTRIC (Left) Patient feeling better this morning. Plan is for discharge to SNF when appropriate. Negative for chest pain and shortness of breath Fever: no Gastrointestinal:Negative for nausea and vomiting  Objective: Vital signs in last 24 hours: Temp:  [97.8 F (36.6 C)-100.8 F (38.2 C)] 97.8 F (36.6 C) (02/28 0630) Pulse Rate:  [74-105] 74 (02/28 0630) Resp:  [16-19] 16 (02/28 0630) BP: (90-121)/(44-62) 121/62 (02/28 0630) SpO2:  [95 %-100 %] 99 % (02/28 0630)  Intake/Output from previous day:  Intake/Output Summary (Last 24 hours) at 03/26/2020 0658 Last data filed at 03/26/2020 0630 Gross per 24 hour  Intake 1849.57 ml  Output --  Net 1849.57 ml    Intake/Output this shift: Total I/O In: 1609.6 [I.V.:1269.6; Blood:340] Out: -   Labs: Recent Labs    03/24/20 0546 03/25/20 1119  HGB 7.6* 6.1*   Recent Labs    03/24/20 0546 03/25/20 1119  WBC 5.7 5.5  RBC 2.51* 2.07*  HCT 23.6* 19.8*  PLT 162 152   Recent Labs    03/24/20 0546 03/25/20 1119  NA 139 140  K 4.0 3.6  CL 108 111  CO2 23 21*  BUN 23 28*  CREATININE 0.85 0.94  GLUCOSE 118* 109*  CALCIUM 8.0* 8.0*   Recent Labs    03/23/20 0917  INR 1.0   EXAM General - Patient is more alert this morning and answering questions. Extremity -clean dry and intact dressing with no drainage.  Sensation intact.  Plantarflexion and dorsiflexion are intact. Motor Function - intact, moving foot and toes well on exam.  Past Medical History:  Diagnosis Date  . HTN (hypertension)     Assessment/Plan: 3 Days Post-Op Procedure(s) (LRB): INTRAMEDULLARY (IM) NAIL INTERTROCHANTRIC (Left) Active Problems:   Closed left hip fracture (HCC)   HTN (hypertension)   Fall   Normocytic anemia  Estimated body mass index is 20.37 kg/m as calculated from the following:   Height as of this encounter: 5\' 3"  (1.6 m).   Weight as of  this encounter: 52.2 kg. Advance diet Up with therapy D/C IV fluids when tolerating po intake.  Acute blood loss anemia.  Hemoglobin 6.1.  Await internal medicine for probable transfusion. Attempt to have her work with PT if able. Avoid narcotics today and continue to monitor. Plan will be for discharge to SNF when appropriate.  Follow-up at [facility] clinic orthopedics in 2 weeks for staple removal and x-rays of the left femur  DVT Prophylaxis - Lovenox and SCDs Toe-touch weightbearing to the left leg.  Reche [facility] Orthopaedic Surgery 03/26/2020, 6:58 AM

## 2020-03-26 NOTE — Progress Notes (Signed)
Blood now ready and picked up from blood bank and infusing. Patient tolerating blood transfusion with out any complaints. Endorsed to receiving RN.

## 2020-03-26 NOTE — Discharge Instructions (Signed)
INSTRUCTIONS AFTER Surgery  o Remove items at home which could result in a fall. This includes throw rugs or furniture in walking pathways o ICE to the affected joint every three hours while awake for 30 minutes at a time, for at least the first 3-5 days, and then as needed for pain and swelling.  Continue to use ice for pain and swelling. You may notice swelling that will progress down to the foot and ankle.  This is normal after surgery.  Elevate your leg when you are not up walking on it.   o Continue to use the breathing machine you got in the hospital (incentive spirometer) which will help keep your temperature down.  It is common for your temperature to cycle up and down following surgery, especially at night when you are not up moving around and exerting yourself.  The breathing machine keeps your lungs expanded and your temperature down.   DIET:  As you were doing prior to hospitalization, we recommend a well-balanced diet.  DRESSING / WOUND CARE / SHOWERING  Dressing change as needed.  No showering.  Staples will be removed in 2 weeks as well as x-rays of the left femur  ACTIVITY  o Increase activity slowly as tolerated, but follow the weight bearing instructions below.   o No driving for 6 weeks or until further direction given by your physician.  You cannot drive while taking narcotics.  o No lifting or carrying greater than 10 lbs. until further directed by your surgeon. o Avoid periods of inactivity such as sitting longer than an hour when not asleep. This helps prevent blood clots.  o You may return to work once you are authorized by your doctor.     WEIGHT BEARING  Toe-touch weightbearing on the left   EXERCISES Gait training and ambulation training with physical therapy.  ADLs with occupational therapy.  CONSTIPATION  Constipation is defined medically as fewer than three stools per week and severe constipation as less than one stool per week.  Even if you have a  regular bowel pattern at home, your normal regimen is likely to be disrupted due to multiple reasons following surgery.  Combination of anesthesia, postoperative narcotics, change in appetite and fluid intake all can affect your bowels.   YOU MUST use at least one of the following options; they are listed in order of increasing strength to get the job done.  They are all available over the counter, and you may need to use some, POSSIBLY even all of these options:    Drink plenty of fluids (prune juice may be helpful) and high fiber foods Colace 100 mg by mouth twice a day  Senokot for constipation as directed and as needed Dulcolax (bisacodyl), take with full glass of water  Miralax (polyethylene glycol) once or twice a day as needed.  If you have tried all these things and are unable to have a bowel movement in the first 3-4 days after surgery call either your surgeon or your primary doctor.    If you experience loose stools or diarrhea, hold the medications until you stool forms back up.  If your symptoms do not get better within 1 week or if they get worse, check with your doctor.  If you experience "the worst abdominal pain ever" or develop nausea or vomiting, please contact the office immediately for further recommendations for treatment.   ITCHING:  If you experience itching with your medications, try taking only a single pain pill, or  even half a pain pill at a time.  You can also use Benadryl over the counter for itching or also to help with sleep.   TED HOSE STOCKINGS:  Use stockings on both legs until for at least 2 weeks or as directed by physician office. They may be removed at night for sleeping.  MEDICATIONS:  See your medication summary on the "After Visit Summary" that nursing will review with you.  You may have some home medications which will be placed on hold until you complete the course of blood thinner medication.  It is important for you to complete the blood thinner  medication as prescribed.  PRECAUTIONS:  If you experience chest pain or shortness of breath - call 911 immediately for transfer to the hospital emergency department.   If you develop a fever greater that 101 F, purulent drainage from wound, increased redness or drainage from wound, foul odor from the wound/dressing, or calf pain - CONTACT YOUR SURGEON.                                                   FOLLOW-UP APPOINTMENTS:  If you do not already have a post-op appointment, please call the office for an appointment to be seen by your surgeon.  Guidelines for how soon to be seen are listed in your "After Visit Summary", but are typically between 1-4 weeks after surgery.  OTHER INSTRUCTIONS:     MAKE SURE YOU:  . Understand these instructions.  . Get help right away if you are not doing well or get worse.    Thank you for letting us be a part of your medical care team.  It is a privilege we respect greatly.  We hope these instructions will help you stay on track for a fast and full recovery!

## 2020-03-26 NOTE — TOC Initial Note (Signed)
Transition of Care [facility]) - Initial/Assessment Note    Patient Details  Name: Laura Michael MRN: [id number] Date of Birth: [birth year]  Transition [facility]) CM/SW Contact:    Shelbie Ammons, RN Phone Number: 03/26/2020, 1:35 PM  Clinical Narrative:   RNCM unable to speak to patient due to her sleeping and per notes has had some confusion during stay. RNCM reached out to patient's son Laura Michael for assessment. Laura Michael reports that patient has a brother that lives there but he is little help to patient and another brother that assists her back and forth to appointments. Laura Michael reports that prior to fall patient was independent with all ADLs and only used a cane occasionally. Lamont verbalizes understanding that patient would benefit from SNF at discharge and does not have any initial preferences. He is agreeable to a search of all local facilities.  RNCM completed PASSR, FL2 and started bed search.               Expected Discharge Plan: Skilled Nursing Facility Barriers to Discharge: No Barriers Identified   Patient Goals and CMS Choice        Expected Discharge Plan and Services Expected Discharge Plan: [place]       Living arrangements for the past 2 months: Single Family Home                                      Prior Living Arrangements/Services Living arrangements for the past 2 months: Single Family Home Lives with:: Adult Children Patient language and need for interpreter reviewed:: Yes Do you feel safe going back to the place where you live?: Yes      Need for Family Participation in Patient Care: Yes (Comment) Care giver support system in place?: Yes (comment)   Criminal Activity/Legal Involvement Pertinent to Current Situation/Hospitalization: No - Comment as needed  Activities of Daily Living Home Assistive Devices/Equipment: Grab bars in shower,Walker (specify type) ADL Screening (condition at time of admission) Patient's cognitive  ability adequate to safely complete daily activities?: Yes Is the patient deaf or have difficulty hearing?: No Does the patient have difficulty seeing, even when wearing glasses/contacts?: No Does the patient have difficulty concentrating, remembering, or making decisions?: No Patient able to express need for assistance with ADLs?: Yes Does the patient have difficulty dressing or bathing?: Yes Independently performs ADLs?: Yes (appropriate for developmental age) Does the patient have difficulty walking or climbing stairs?: Yes Weakness of Legs: Both Weakness of Arms/Hands: None  Permission Sought/Granted Permission sought to share information with : Family Supports                Emotional Assessment Appearance:: Appears stated age       Alcohol / Substance Use: Not Applicable Psych Involvement: No (comment)  Admission diagnosis:  Fall [W19.XXXA] Closed left hip fracture ([place]) [S72.002A] Patient Active Problem List   Diagnosis Date Noted  . Closed left hip fracture ([place]) 03/23/2020  . Fall 03/23/2020  . Normocytic anemia 03/23/2020  . HTN (hypertension)    PCP:  System, Provider Not In Pharmacy:   [id number] - [place], [place] [address] [place] [address] Phone: [phone] Fax: [phone]     Social Determinants of Health (SDOH) Interventions    Readmission Risk Interventions No flowsheet data found.

## 2020-03-26 NOTE — Care Management Important Message (Signed)
Important Message  Patient Details  Name: Laura Michael MRN: [id number] Date of Birth: Jun 14, 1934   Medicare Important Message Given:  N/A - LOS <3 / Initial given by admissions     [name] 03/26/2020, 12:03 PM

## 2020-03-27 LAB — TYPE AND SCREEN
ABO/RH(D): O POS
Antibody Screen: NEGATIVE
Unit division: 0

## 2020-03-27 LAB — BPAM RBC
Blood Product Expiration Date: 202203302359
ISSUE DATE / TIME: 202202280303
Unit Type and Rh: 5100

## 2020-03-27 LAB — CBC WITH DIFFERENTIAL/PLATELET
Abs Immature Granulocytes: 0.03 10*3/uL (ref 0.00–0.07)
Basophils Absolute: 0 10*3/uL (ref 0.0–0.1)
Basophils Relative: 0 %
Eosinophils Absolute: 0 10*3/uL (ref 0.0–0.5)
Eosinophils Relative: 1 %
HCT: 21 % — ABNORMAL LOW (ref 36.0–46.0)
Hemoglobin: 6.7 g/dL — ABNORMAL LOW (ref 12.0–15.0)
Immature Granulocytes: 1 %
Lymphocytes Relative: 19 %
Lymphs Abs: 0.8 10*3/uL (ref 0.7–4.0)
MCH: 29.8 pg (ref 26.0–34.0)
MCHC: 31.9 g/dL (ref 30.0–36.0)
MCV: 93.3 fL (ref 80.0–100.0)
Monocytes Absolute: 0.3 10*3/uL (ref 0.1–1.0)
Monocytes Relative: 8 %
Neutro Abs: 2.9 10*3/uL (ref 1.7–7.7)
Neutrophils Relative %: 71 %
Platelets: 129 10*3/uL — ABNORMAL LOW (ref 150–400)
RBC: 2.25 MIL/uL — ABNORMAL LOW (ref 3.87–5.11)
RDW: 16.8 % — ABNORMAL HIGH (ref 11.5–15.5)
WBC: 4.1 10*3/uL (ref 4.0–10.5)
nRBC: 0 % (ref 0.0–0.2)

## 2020-03-27 LAB — BASIC METABOLIC PANEL
Anion gap: 6 (ref 5–15)
BUN: 15 mg/dL (ref 8–23)
CO2: 22 mmol/L (ref 22–32)
Calcium: 7.9 mg/dL — ABNORMAL LOW (ref 8.9–10.3)
Chloride: 115 mmol/L — ABNORMAL HIGH (ref 98–111)
Creatinine, Ser: 0.6 mg/dL (ref 0.44–1.00)
GFR, Estimated: >60 mL/min (ref >60)
Glucose, Bld: 99 mg/dL (ref 70–99)
Potassium: 3.4 mmol/L — ABNORMAL LOW (ref 3.5–5.1)
Sodium: 143 mmol/L (ref 135–145)

## 2020-03-27 LAB — PREPARE RBC (CROSSMATCH)

## 2020-03-27 MED ORDER — LISINOPRIL 20 MG PO TABS
20.0000 mg | ORAL_TABLET | Freq: Every day | ORAL | Status: DC
  Administered 2020-03-27 – 2020-03-29 (×3): 20 mg via ORAL
  Filled 2020-03-27 (×3): qty 1

## 2020-03-27 MED ORDER — SODIUM CHLORIDE 0.9% IV SOLUTION: Freq: Once | INTRAVENOUS | Status: AC

## 2020-03-27 MED ORDER — HYDROCHLOROTHIAZIDE 25 MG PO TABS
25.0000 mg | ORAL_TABLET | Freq: Every day | ORAL | Status: DC
  Administered 2020-03-27 – 2020-03-29 (×3): 25 mg via ORAL
  Filled 2020-03-27 (×2): qty 1

## 2020-03-27 NOTE — Progress Notes (Signed)
OT Cancellation Note  Patient Details Name: Laura Michael MRN: [id number] DOB: Sep 05, 1934   Cancelled Treatment:    Reason Eval/Treat Not Completed: Medical issues which prohibited therapy. Pt's Hgb 6.7, is receiving transfusion. Will continue to follow and provide OT services when pt is medically ready.  Josiah Lobo 03/27/2020, 3:39 PM

## 2020-03-27 NOTE — Progress Notes (Signed)
Physical Therapy Treatment Patient Details Name: Laura Michael MRN: [id number] DOB: [birth year] Today's Date: 03/27/2020    History of Present Illness Laura Michael is a 86 y.o. female with medical history significant of anemia, hypertension, who presents with fall and left hip and knee pain. Pt diagnosed with left reverse obliquity hip fracture and is s/p intramedullary nailing of L femur with cephalomedullary device, pt is touchdown WB on LLE    PT Comments    Pt's most recent Hgb 6.7 with pt scheduled for transfusion later this date. Per MD ok for pt to participate with bed therex only.  Pt was pleasant and motivated to participate during the session and put forth very good effort during below therex.  Pt reported no adverse symptoms during the session other than L hip pain that did not worsen with therex.  Pt's SpO2 and HR WNL throughout.  Pt will benefit from PT services in a SNF setting upon discharge to safely address deficits listed in patient problem list for decreased caregiver assistance and eventual return to PLOF.    Follow Up Recommendations  SNF     Equipment Recommendations  None recommended by PT    Recommendations for Other Services       Precautions / Restrictions Precautions Precautions: Fall Precaution Comments: LLE Foot Flat touchdown weightbearing Restrictions Weight Bearing Restrictions: Yes LLE Weight Bearing: Touchdown weight bearing Other Position/Activity Restrictions: TDWB on the LLE in foot flat position    Mobility  Bed Mobility               General bed mobility comments: NT secondary to low Hgb, supine therex only this session per MD request    Transfers                    Ambulation/Gait                 Stairs             Wheelchair Mobility    Modified Rankin (Stroke Patients Only)       Balance                                            Cognition Arousal/Alertness:  Awake/alert Behavior During Therapy: WFL for tasks assessed/performed Overall Cognitive Status: Within Functional Limits for tasks assessed                                        Exercises Total Joint Exercises Ankle Circles/Pumps: AROM;Both;15 reps;Strengthening;10 reps (with manual resistance) Quad Sets: Strengthening;Both;15 reps;10 reps Gluteal Sets: 10 reps;15 reps;Both;Strengthening Towel Squeeze: Strengthening;Both;10 reps Short Arc Quad: Strengthening;Both;10 reps (with manual resistance) Heel Slides: Strengthening;AAROM;Both;10 reps Hip ABduction/ADduction: AAROM;10 reps;Strengthening;Both;15 reps Straight Leg Raises: Strengthening;Both;AAROM;10 reps;15 reps Long Arc Quad: AROM;10 reps;Strengthening;15 reps;Both Bridges: Right;10 reps;5 reps Other Exercises Other Exercises: Supine leg press to the RLE 2 x 10 with manual resistance    General Comments        Pertinent Vitals/Pain Pain Assessment: 0-10 Pain Score: 7  Pain Location: left knee and hip Pain Descriptors / Indicators: Aching;Sore Pain Intervention(s): Premedicated before session;Monitored during session;Repositioned    Home Living  Prior Function            PT Goals (current goals can now be found in the care plan section) Progress towards PT goals: Not progressing toward goals - comment (Supine therex only this session secondary to low Hgb)    Frequency    BID      PT Plan Current plan remains appropriate    Co-evaluation              AM-PAC PT "6 Clicks" Mobility   Outcome Measure  Help needed turning from your back to your side while in a flat bed without using bedrails?: A Little Help needed moving from lying on your back to sitting on the side of a flat bed without using bedrails?: A Lot Help needed moving to and from a bed to a chair (including a wheelchair)?: A Lot Help needed standing up from a chair using your arms (e.g.,  wheelchair or bedside chair)?: A Lot Help needed to walk in hospital room?: Total Help needed climbing 3-5 steps with a railing? : Total 6 Click Score: 11    End of Session   Activity Tolerance: Patient tolerated treatment well;No increased pain Patient left: in bed;with call bell/phone within reach;with bed alarm set;with family/visitor present Nurse Communication: Mobility status PT Visit Diagnosis: Unsteadiness on feet (R26.81);Muscle weakness (generalized) (M62.81);Difficulty in walking, not elsewhere classified (R26.2)     Time: 2111-5520 PT Time Calculation (min) (ACUTE ONLY): 29 min  Charges:  $Therapeutic Exercise: 23-37 mins                     D. Royetta Asal PT, DPT 03/27/20, 4:56 PM

## 2020-03-27 NOTE — Progress Notes (Signed)
   03/25/20 1448  Assess: MEWS Score  Temp 99.6 F (37.6 C)  BP (!) 90/48  Pulse Rate (!) 104  Resp 16  SpO2 97 %  O2 Device Room Air  Assess: MEWS Score  MEWS Temp 0  MEWS Systolic 1  MEWS Pulse 1  MEWS RR 0  MEWS LOC 0  MEWS Score 2  MEWS Score Color Yellow  Assess: if the MEWS score is Yellow or Red  Were vital signs taken at a resting state? Yes  Focused Assessment No change from prior assessment  Early Detection of Sepsis Score *See Row Information* Low  MEWS guidelines implemented *See Row Information* Yes  Treat  MEWS Interventions Administered prn meds/treatments;Escalated (See documentation below)  Pain Scale 0-10  Pain Score 0  Take Vital Signs  Increase Vital Sign Frequency  Yellow: Q 2hr X 2 then Q 4hr X 2, if remains yellow, continue Q 4hrs  Escalate  MEWS: Escalate Yellow: discuss with charge nurse/RN and consider discussing with provider and RRT  Notify: Charge Nurse/RN  Name of Charge Nurse/RN Notified chris  Date Charge Nurse/RN Notified 03/25/20  Time Charge Nurse/RN Notified 1445  Notify: Provider  Provider Name/Title sreenath  Date Provider Notified 03/25/20  Time Provider Notified 1445  Notification Type Call  Notification Reason Change in status  Provider response See new orders  Date of Provider Response 03/25/20  Time of Provider Response 1610  Inserted for Linward Foster RN

## 2020-03-27 NOTE — Progress Notes (Signed)
Physical Therapy Treatment Patient Details Name: Laura Michael MRN: [id number] DOB: 1934-11-16 Today's Date: 03/27/2020    History of Present Illness Laura Michael is a 86 y.o. female with medical history significant of anemia, hypertension, who presents with fall and left hip and knee pain. Pt diagnosed with left reverse obliquity hip fracture and is s/p intramedullary nailing of L femur with cephalomedullary device, pt is touchdown WB on LLE    PT Comments    Pt's Hgb 6.7, per MD ok for pt to participate with bed therex only.  Pt was pleasant and motivated to participate during the session and put forth very good effort during below therex.  Pt reported no adverse symptoms during the session other than L hip pain that did not worsen with therex.  Pt's SpO2 and HR WNL throughout.  Pt will benefit from PT services in a SNF setting upon discharge to safely address deficits listed in patient problem list for decreased caregiver assistance and eventual return to PLOF.     Follow Up Recommendations  SNF     Equipment Recommendations  None recommended by PT    Recommendations for Other Services       Precautions / Restrictions Precautions Precautions: Fall Precaution Comments: LLE Foot Flat touchdown weightbearing Restrictions Weight Bearing Restrictions: Yes LLE Weight Bearing: Touchdown weight bearing Other Position/Activity Restrictions: TDWB on the LLE in foot flat position    Mobility  Bed Mobility                    Transfers                    Ambulation/Gait                 Stairs             Wheelchair Mobility    Modified Rankin (Stroke Patients Only)       Balance                                            Cognition Arousal/Alertness: Awake/alert Behavior During Therapy: WFL for tasks assessed/performed Overall Cognitive Status: Within Functional Limits for tasks assessed                                         Exercises Total Joint Exercises Ankle Circles/Pumps: AROM;Both;15 reps;Strengthening;10 reps (with manual resistance) Quad Sets: Strengthening;Both;15 reps;10 reps Gluteal Sets: 10 reps;15 reps;Both;Strengthening Towel Squeeze: Strengthening;Both;10 reps;15 reps Short Arc Quad: Strengthening;Both;10 reps;15 reps (with manual resistance) Heel Slides: Strengthening;AAROM;Both;10 reps;15 reps Hip ABduction/ADduction: AAROM;10 reps;Strengthening;Both;15 reps Straight Leg Raises: Strengthening;Both;AAROM;10 reps;15 reps Bridges: Right;10 reps;5 reps Other Exercises Other Exercises: Supine leg press to the RLE 2 x 10 with manual resistance    General Comments        Pertinent Vitals/Pain Pain Assessment: 0-10 Pain Score: 7  Pain Location: left knee and hip Pain Descriptors / Indicators: Aching;Sore Pain Intervention(s): Premedicated before session;Monitored during session;Repositioned    Home Living                      Prior Function            PT Goals (current goals can now be found in the care plan section) Progress  towards PT goals: Not progressing toward goals - comment (Limited this session by low Hgb)    Frequency    BID      PT Plan Current plan remains appropriate    Co-evaluation              AM-PAC PT "6 Clicks" Mobility   Outcome Measure  Help needed turning from your back to your side while in a flat bed without using bedrails?: A Little Help needed moving from lying on your back to sitting on the side of a flat bed without using bedrails?: A Lot Help needed moving to and from a bed to a chair (including a wheelchair)?: A Lot Help needed standing up from a chair using your arms (e.g., wheelchair or bedside chair)?: A Lot Help needed to walk in hospital room?: Total Help needed climbing 3-5 steps with a railing? : Total 6 Click Score: 11    End of Session   Activity Tolerance: Patient tolerated treatment  well Patient left: in bed;with call bell/phone within reach;with bed alarm set;with family/visitor present Nurse Communication: Mobility status PT Visit Diagnosis: Unsteadiness on feet (R26.81);Muscle weakness (generalized) (M62.81);Difficulty in walking, not elsewhere classified (R26.2)     Time: 0937-1000 PT Time Calculation (min) (ACUTE ONLY): 23 min  Charges:  $Therapeutic Exercise: 23-37 mins                    D. Scott Corderro Koloski PT, DPT 03/27/20, 1:43 PM

## 2020-03-27 NOTE — Progress Notes (Signed)
PROGRESS NOTE    Laura Michael  TMA:[id number] DOB: Oct 22, 1934 DOA: 03/23/2020 PCP: System, Provider Not In  Brief Narrative:  86 y.o. female with medical history significant of anemia, hypertension, who presents with fall and left hip and knee pain.  Pt states that she fell accidentally landing on her left side at about 4:00 AM when she was walking to the bathroom. She injured her left knee and hip. No LOC. No head or neck injury. She developed pain in left knee and left hip, which is constant, sharp, moderate to severe, nonradiating. States she cannot move the leg due to pain  X-ray of left hip/pelvis showed displaced left hip intertrochanteric fracture. X-ray of the left knee is negative for fracture. Patient is admitted to [place] bed as inpatient. Dr. Posey Pronto of Ortho is consulted.  Status post IM nail left femur on 2/25.  Tolerated procedure well with no complications.  Has had some postoperative delirium worse on the evening of 2/26.  Received 2 doses of IV Haldol overnight.  Delirium much improved.  Patient stable, calm, mentating clearly on the morning of 2/28.  Mental status is remained at baseline  Patient has had postoperative anemia requiring 2 units transfusion PRBC  Assessment & Plan:   Active Problems:   Closed left hip fracture (HCC)   HTN (hypertension)   Fall   Normocytic anemia  Hospital-acquired postoperative delirium, improved Patient became agitated on 2/26 evening Combative and confused, attempting to get out of bed Removed her IV line and refusing lab draws As of 2/28 this is resolved Plan: Nonpharmacologic methods Delirium precautions Frequent reorientation As needed Haldol IM/p.o. ordered for agitation not responding to nonpharmacologic methods No indication for sitter  Acute on chronic postoperative blood loss anemia  Hemoglobin 10.3 on arrival No previous baseline hemoglobin Hemoglobin drifting down 2/27, hemoglobin 6.1, transfuse 1  unit 2/28, hemoglobin 7.6 3/1: Hemoglobin 6.7, transfuse 1 unit Plan: Transfuse 1 unit PRBC Hold chemoprophylaxis today, place SCDs Restart chemoprophylaxis if hemoglobin stable on 3/2 If hemoglobin continues to drift down consider repeating imaging  Closed left hip fracture (HCC)  X-ray showed acute angulated displaced left hip intertrochanteric fracture.  Status post IM nail with orthopedics 2/25 Tolerated procedure well Postoperative pain mild Plan: Multimodal pain control Avoid IV narcotics and sedatives if able Continue therapy evaluations as tolerated TOC consult for skilled nursing facility placement   HTN (hypertension) -Prinzide -IV prn hydralazine  Fall Therapy evaluations TOC consult for SNF placement     DVT prophylaxis: SQ Lovenox Code Status: Full Family Communication: Son at bedside, 2/28; daughter-in-law at bedside 3/1 disposition Plan: Status is: Inpatient  Remains inpatient appropriate because:Altered mental status and Inpatient level of care appropriate due to severity of illness   Dispo: The patient is from: Home              Anticipated d/c is to: SNF              Patient currently is not medically stable to d/c.   Difficult to place patient No   Patient recovering from surgery well.  Still issues with postoperative blood loss anemia.  Transfusing 1 unit packed red blood cells today.  Recheck hemoglobin 32.  If stable patient will be stable for discharge to skilled nursing facility once bed is found.  Level of care: Med-Surg  Consultants:   Orthopedics  Procedures:   IM nail, 2/25  Antimicrobials:   None   Subjective: Patient seen and examined.  Mentating clearly.  Sitting up in bed.  Daughter-in-law at bedside.  Some pain in left lower extremity but improved from prior. Objective: Vitals:   03/27/20 0430 03/27/20 0737 03/27/20 0956 03/27/20 1045  BP: (!) 125/55 116/66 (!) 105/55 122/64  Pulse: 74 68  91  Resp: 20 18  18    Temp: 98.4 F (36.9 C) 98.3 F (36.8 C)  98.3 F (36.8 C)  TempSrc:      SpO2: 94% 100%  99%  Weight:      Height:        Intake/Output Summary (Last 24 hours) at 03/27/2020 1442 Last data filed at 03/27/2020 0600 Gross per 24 hour  Intake 120 ml  Output 1400 ml  Net -1280 ml   Filed Weights   03/23/20 0626  Weight: 52.2 kg    Examination:  General exam: Appears calm and comfortable  Respiratory system: Clear to auscultation. Respiratory effort normal. Cardiovascular system: S1 & S2 heard, RRR. No JVD, murmurs, rubs, gallops or clicks. No pedal edema. Gastrointestinal system: Abdomen is nondistended, soft and nontender. No organomegaly or masses felt. Normal bowel sounds heard. Central nervous system: Alert and oriented. No focal neurological deficits. Extremities: Left lower extremity in surgical dressing.  Not removed.  Mild tender to palpation.  Good pedal pulses  skin: No rashes, lesions or ulcers Psychiatry: Judgement and insight appear normal. Mood & affect appropriate.     Data Reviewed: I have personally reviewed following labs and imaging studies  CBC: Recent Labs  Lab 03/23/20 0629 03/24/20 0546 03/25/20 1119 03/26/20 1013 03/27/20 0818  WBC 5.9 5.7 5.5 5.6 4.1  NEUTROABS 4.1  --   --  4.4 2.9  HGB 10.3* 7.6* 6.1* 7.6* 6.7*  HCT 33.5* 23.6* 19.8* 23.8* 21.0*  MCV 96.0 94.0 95.7 93.7 93.3  PLT 171 162 152 146* 941*   Basic Metabolic Panel: Recent Labs  Lab 03/23/20 0629 03/24/20 0546 03/25/20 1119 03/26/20 1013 03/27/20 0818  NA 141 139 140 142 143  K 4.0 4.0 3.6 3.5 3.4*  CL 109 108 111 113* 115*  CO2 26 23 21* 22 22  GLUCOSE 103* 118* 109* 115* 99  BUN 22 23 28* 22 15  CREATININE 0.83 0.85 0.94 0.80 0.60  CALCIUM 8.9 8.0* 8.0* 8.1* 7.9*   GFR: Estimated Creatinine Clearance: 43.9 mL/min (by C-G formula based on SCr of 0.6 mg/dL). Liver Function Tests: No results for input(s): AST, ALT, ALKPHOS, BILITOT, PROT, ALBUMIN in the last 168  hours. No results for input(s): LIPASE, AMYLASE in the last 168 hours. No results for input(s): AMMONIA in the last 168 hours. Coagulation Profile: Recent Labs  Lab 03/23/20 0917  INR 1.0   Cardiac Enzymes: No results for input(s): CKTOTAL, CKMB, CKMBINDEX, TROPONINI in the last 168 hours. BNP (last 3 results) No results for input(s): PROBNP in the last 8760 hours. HbA1C: No results for input(s): HGBA1C in the last 72 hours. CBG: No results for input(s): GLUCAP in the last 168 hours. Lipid Profile: No results for input(s): CHOL, HDL, LDLCALC, TRIG, CHOLHDL, LDLDIRECT in the last 72 hours. Thyroid Function Tests: No results for input(s): TSH, T4TOTAL, FREET4, T3FREE, THYROIDAB in the last 72 hours. Anemia Panel: No results for input(s): VITAMINB12, FOLATE, FERRITIN, TIBC, IRON, RETICCTPCT in the last 72 hours. Sepsis Labs: No results for input(s): PROCALCITON, LATICACIDVEN in the last 168 hours.  Recent Results (from the past 240 hour(s))  Resp Panel by RT-PCR (Flu A&B, Covid) Nasopharyngeal Swab     Status: None   Collection  Time: 03/23/20  8:40 AM   Specimen: Nasopharyngeal Swab; Nasopharyngeal(NP) swabs in vial transport medium  Result Value Ref Range Status   SARS Coronavirus 2 by RT PCR NEGATIVE NEGATIVE Final    Comment: (NOTE) SARS-CoV-2 target nucleic acids are NOT DETECTED.  The SARS-CoV-2 RNA is generally detectable in upper respiratory specimens during the acute phase of infection. The lowest concentration of SARS-CoV-2 viral copies this assay can detect is 138 copies/mL. A negative result does not preclude SARS-Cov-2 infection and should not be used as the sole basis for treatment or other patient management decisions. A negative result may occur with  improper specimen collection/handling, submission of specimen other than nasopharyngeal swab, presence of viral mutation(s) within the areas targeted by this assay, and inadequate number of viral copies(<138  copies/mL). A negative result must be combined with clinical observations, patient history, and epidemiological information. The expected result is Negative.  Fact Sheet for Patients:  EntrepreneurPulse.com.au  Fact Sheet for Healthcare Providers:  IncredibleEmployment.be  This test is no t yet approved or cleared by the [place] FDA and  has been authorized for detection and/or diagnosis of SARS-CoV-2 by FDA under an Emergency Use Authorization (EUA). This EUA will remain  in effect (meaning this test can be used) for the duration of the COVID-19 declaration under Section 564(b)(1) of the Act, 21 U.S.C.section 360bbb-3(b)(1), unless the authorization is terminated  or revoked sooner.       Influenza A by PCR NEGATIVE NEGATIVE Final   Influenza B by PCR NEGATIVE NEGATIVE Final    Comment: (NOTE) The Xpert Xpress SARS-CoV-2/FLU/RSV plus assay is intended as an aid in the diagnosis of influenza from Nasopharyngeal swab specimens and should not be used as a sole basis for treatment. Nasal washings and aspirates are unacceptable for Xpert Xpress SARS-CoV-2/FLU/RSV testing.  Fact Sheet for Patients: EntrepreneurPulse.com.au  Fact Sheet for Healthcare Providers: IncredibleEmployment.be  This test is not yet approved or cleared by the [place] FDA and has been authorized for detection and/or diagnosis of SARS-CoV-2 by FDA under an Emergency Use Authorization (EUA). This EUA will remain in effect (meaning this test can be used) for the duration of the COVID-19 declaration under Section 564(b)(1) of the Act, 21 U.S.C. section 360bbb-3(b)(1), unless the authorization is terminated or revoked.  Performed at [facility], [address] [place], [place] [address]          Radiology Studies: No results found.      Scheduled Meds: . sodium chloride   Intravenous Once  .  acetaminophen  1,000 mg Oral Q8H  . Chlorhexidine Gluconate Cloth  6 each Topical Daily  . diclofenac Sodium  2 g Topical QID  . docusate sodium  100 mg Oral BID  . feeding supplement  237 mL Oral BID BM  . lisinopril  20 mg Oral Daily   And  . hydrochlorothiazide  25 mg Oral Daily  . melatonin  3 mg Oral QHS  . multivitamin with minerals  1 tablet Oral Daily   Continuous Infusions:    LOS: 4 days    Time spent: 25 minutes    Sidney Ace, MD [facility] Pager [phone]  If 7PM-7AM, please contact night-coverage 03/27/2020, 2:42 PM

## 2020-03-27 NOTE — Plan of Care (Signed)

## 2020-03-27 NOTE — Progress Notes (Signed)
Subjective: 4 Days Post-Op Procedure(s) (LRB): INTRAMEDULLARY (IM) NAIL INTERTROCHANTRIC (Left) Patient feeling better this morning.  She received 1 unit of transfused blood yesterday. The patient has had a bowel movement. Plan is for discharge to SNF when appropriate. Negative for chest pain and shortness of breath Fever: no Gastrointestinal:Negative for nausea and vomiting  Objective: Vital signs in last 24 hours: Temp:  [97.8 F (36.6 C)-98.7 F (37.1 C)] 98.4 F (36.9 C) (03/01 0430) Pulse Rate:  [73-89] 74 (03/01 0430) Resp:  [16-20] 20 (03/01 0430) BP: (96-125)/(53-64) 125/55 (03/01 0430) SpO2:  [94 %-99 %] 94 % (03/01 0430)  Intake/Output from previous day:  Intake/Output Summary (Last 24 hours) at 03/27/2020 0644 Last data filed at 03/27/2020 0600 Gross per 24 hour  Intake 180 ml  Output 1400 ml  Net -1220 ml    Intake/Output this shift: Total I/O In: 120 [P.O.:120] Out: 1400 [Urine:1400]  Labs: Recent Labs    03/25/20 1119 03/26/20 1013  HGB 6.1* 7.6*   Recent Labs    03/25/20 1119 03/26/20 1013  WBC 5.5 5.6  RBC 2.07* 2.54*  HCT 19.8* 23.8*  PLT 152 146*   Recent Labs    03/25/20 1119 03/26/20 1013  NA 140 142  K 3.6 3.5  CL 111 113*  CO2 21* 22  BUN 28* 22  CREATININE 0.94 0.80  GLUCOSE 109* 115*  CALCIUM 8.0* 8.1*   No results for input(s): LABPT, INR in the last 72 hours. EXAM General - Patient is more alert this morning and answering questions. Extremity -clean dry and intact dressing with no drainage.  Sensation intact.  Plantarflexion and dorsiflexion are intact. Motor Function - intact, moving foot and toes well on exam.  Past Medical History:  Diagnosis Date  . HTN (hypertension)     Assessment/Plan: 4 Days Post-Op Procedure(s) (LRB): INTRAMEDULLARY (IM) NAIL INTERTROCHANTRIC (Left) Active Problems:   Closed left hip fracture (HCC)   HTN (hypertension)   Fall   Normocytic anemia  Estimated body mass index is 20.37  kg/m as calculated from the following:   Height as of this encounter: 5\' 3"  (1.6 m).   Weight as of this encounter: 52.2 kg. Advance diet Up with therapy D/C IV fluids when tolerating po intake.  Acute blood loss anemia.  Hemoglobin 7.6 after receiving 1 unit of transfused blood yesterday.    Continue to work with physical therapy. Avoid narcotics today and continue to monitor. Plan will be for discharge to SNF when appropriate.  Follow-up at [facility] clinic orthopedics in 2 weeks for staple removal and x-rays of the left femur  DVT Prophylaxis - Lovenox and SCDs Toe-touch weightbearing to the left leg.  Reche Dixon [facility] Orthopaedic Surgery 03/27/2020, 6:44 AM

## 2020-03-28 DIAGNOSIS — D62 Acute posthemorrhagic anemia: Secondary | ICD-10-CM | POA: Diagnosis not present

## 2020-03-28 DIAGNOSIS — S72002A Fracture of unspecified part of neck of left femur, initial encounter for closed fracture: Secondary | ICD-10-CM | POA: Diagnosis not present

## 2020-03-28 DIAGNOSIS — I10 Essential (primary) hypertension: Secondary | ICD-10-CM

## 2020-03-28 DIAGNOSIS — W19XXXA Unspecified fall, initial encounter: Secondary | ICD-10-CM | POA: Diagnosis not present

## 2020-03-28 HISTORY — DX: Acute posthemorrhagic anemia: D62

## 2020-03-28 LAB — CBC WITH DIFFERENTIAL/PLATELET
Abs Immature Granulocytes: 0.04 10*3/uL (ref 0.00–0.07)
Basophils Absolute: 0 10*3/uL (ref 0.0–0.1)
Basophils Relative: 0 %
Eosinophils Absolute: 0.1 10*3/uL (ref 0.0–0.5)
Eosinophils Relative: 2 %
HCT: 25.8 % — ABNORMAL LOW (ref 36.0–46.0)
Hemoglobin: 8.2 g/dL — ABNORMAL LOW (ref 12.0–15.0)
Immature Granulocytes: 1 %
Lymphocytes Relative: 23 %
Lymphs Abs: 1.1 10*3/uL (ref 0.7–4.0)
MCH: 29.7 pg (ref 26.0–34.0)
MCHC: 31.8 g/dL (ref 30.0–36.0)
MCV: 93.5 fL (ref 80.0–100.0)
Monocytes Absolute: 0.4 10*3/uL (ref 0.1–1.0)
Monocytes Relative: 8 %
Neutro Abs: 3 10*3/uL (ref 1.7–7.7)
Neutrophils Relative %: 66 %
Platelets: 135 10*3/uL — ABNORMAL LOW (ref 150–400)
RBC: 2.76 MIL/uL — ABNORMAL LOW (ref 3.87–5.11)
RDW: 15.8 % — ABNORMAL HIGH (ref 11.5–15.5)
WBC: 4.6 10*3/uL (ref 4.0–10.5)
nRBC: 0 % (ref 0.0–0.2)

## 2020-03-28 LAB — TYPE AND SCREEN
ABO/RH(D): O POS
Antibody Screen: NEGATIVE
Unit division: 0

## 2020-03-28 LAB — BPAM RBC
Blood Product Expiration Date: 202204012359
ISSUE DATE / TIME: 202203011654
Unit Type and Rh: 5100

## 2020-03-28 LAB — BASIC METABOLIC PANEL
Anion gap: 6 (ref 5–15)
BUN: 13 mg/dL (ref 8–23)
CO2: 24 mmol/L (ref 22–32)
Calcium: 8.1 mg/dL — ABNORMAL LOW (ref 8.9–10.3)
Chloride: 111 mmol/L (ref 98–111)
Creatinine, Ser: 0.58 mg/dL (ref 0.44–1.00)
GFR, Estimated: >60 mL/min (ref >60)
Glucose, Bld: 96 mg/dL (ref 70–99)
Potassium: 3.5 mmol/L (ref 3.5–5.1)
Sodium: 141 mmol/L (ref 135–145)

## 2020-03-28 NOTE — Progress Notes (Signed)
Physical Therapy Treatment Patient Details Name: Laura Michael MRN: [id number] DOB: [birth year] Today's Date: 03/28/2020    History of Present Illness Laura Michael is a 86 y.o. female with medical history significant of anemia, hypertension, who presents with fall and left hip and knee pain. Pt diagnosed with left reverse obliquity hip fracture and is s/p intramedullary nailing of L femur with cephalomedullary device, pt is touchdown WB on LLE    PT Comments    Pt was pleasant and motivated to participate during the session.  Pt required Mod A with bed mobility tasks for BLE and trunk control.  Pt demonstrated improved functional strength this session during transfer training with only min A needed to come to standing.  Pt's L foot placed on PT's foot during standing to ensure WB compliance and pt put no more than the weight of her leg through her foot and demonstrated good stability upon coming to standing.  Pt was able to take several small shuffling steps at the EOB but was unable to clear her R foot from the floor while maintaining LLE WB status compliance.  Pt will benefit from PT services in a SNF setting upon discharge to safely address deficits listed in patient problem list for decreased caregiver assistance and eventual return to PLOF.     Follow Up Recommendations  SNF     Equipment Recommendations  None recommended by PT    Recommendations for Other Services       Precautions / Restrictions Precautions Precautions: Fall Precaution Comments: LLE Foot Flat touchdown weightbearing Restrictions Weight Bearing Restrictions: Yes LLE Weight Bearing: Touchdown weight bearing Other Position/Activity Restrictions: TDWB on the LLE in foot flat position    Mobility  Bed Mobility Overal bed mobility: Needs Assistance Bed Mobility: Sit to Supine;Supine to Sit     Supine to sit: Mod assist Sit to supine: Mod assist   General bed mobility comments: Mod A for BLE and  trunk control    Transfers Overall transfer level: Needs assistance Equipment used: Rolling walker (2 wheeled) Transfers: Sit to/from Stand Sit to Stand: Min assist         General transfer comment: Improved concentric and eccentric control with transfers with good WB compliance with pt's L foot on top of PT's foot during initial stand to ensure compliance  Ambulation/Gait Ambulation/Gait assistance: Mod assist Gait Distance (Feet): 1 Feet Assistive device: Rolling walker (2 wheeled) Gait Pattern/deviations: Trunk flexed;Shuffle Gait velocity: decreased   General Gait Details: Pt only able to shuffle her R foot on the floor during ambulation attempts but unable to clear the R foot from the floor and still maintain LLE WB status   Stairs             Wheelchair Mobility    Modified Rankin (Stroke Patients Only)       Balance Overall balance assessment: Needs assistance   Sitting balance-Leahy Scale: Good Sitting balance - Comments: Pt with good seated balance with unsupported sitting at EOB   Standing balance support: Bilateral upper extremity supported;During functional activity Standing balance-Leahy Scale: Fair Standing balance comment: Mod lean on the RW for support                            Cognition Arousal/Alertness: Awake/alert Behavior During Therapy: WFL for tasks assessed/performed Overall Cognitive Status: Within Functional Limits for tasks assessed  Exercises Total Joint Exercises Ankle Circles/Pumps: AROM;Both;Strengthening;10 reps (with manual resistance) Quad Sets: Strengthening;Both;10 reps Gluteal Sets: 10 reps;Both;Strengthening Towel Squeeze: Strengthening;Both;10 reps Short Arc Quad: Strengthening;Both;10 reps;15 reps (with manual resistance) Heel Slides: Strengthening;AAROM;Both;10 reps Hip ABduction/ADduction: AAROM;10 reps;Strengthening;Both Straight Leg Raises:  Strengthening;Both;AAROM;10 reps Long Arc Quad: AROM;10 reps;Strengthening;Both Knee Flexion: AROM;Strengthening;Both;10 reps Bridges: Right;10 reps;15 reps Other Exercises Other Exercises: Supine leg press to the RLE x 10 with manual resistance    General Comments        Pertinent Vitals/Pain Pain Assessment: 0-10 Pain Score: 7  Pain Location: left knee and hip Pain Descriptors / Indicators: Aching;Sore Pain Intervention(s): Premedicated before session;Monitored during session    Home Living                      Prior Function            PT Goals (current goals can now be found in the care plan section) Progress towards PT goals: Progressing toward goals    Frequency    BID      PT Plan Current plan remains appropriate    Co-evaluation              AM-PAC PT "6 Clicks" Mobility   Outcome Measure  Help needed turning from your back to your side while in a flat bed without using bedrails?: A Little   Help needed moving to and from a bed to a chair (including a wheelchair)?: A Lot Help needed standing up from a chair using your arms (e.g., wheelchair or bedside chair)?: A Lot Help needed to walk in hospital room?: Total Help needed climbing 3-5 steps with a railing? : Total 6 Click Score: 9    End of Session Equipment Utilized During Treatment: Gait belt Activity Tolerance: Patient tolerated treatment well;No increased pain Patient left: in bed;with call bell/phone within reach;with bed alarm set;with family/visitor present Nurse Communication: Mobility status PT Visit Diagnosis: Unsteadiness on feet (R26.81);Muscle weakness (generalized) (M62.81);Difficulty in walking, not elsewhere classified (R26.2)     Time: 9935-7017 PT Time Calculation (min) (ACUTE ONLY): 38 min  Charges:  $Therapeutic Exercise: 23-37 mins $Therapeutic Activity: 8-22 mins                     D. Scott Ramona Slinger PT, DPT 03/28/20, 11:54 AM

## 2020-03-28 NOTE — TOC Progression Note (Signed)
Transition of Care [facility]) - Progression Note    Patient Details  Name: Laura Michael MRN: [id number] Date of Birth: 1934/12/19  Transition of Care [facility]) CM/SW [place], RN Phone Number: 03/28/2020, 8:56 AM  Clinical Narrative:   RNCM met with patient and daughter in law at bedside to present bed offers of [facility], [facility] and [facility]. Patient reports that she is fully vaccinated and has had her booster. She does not remember which one but says her card is in her purse. Medicare.gov ratings of facilities was left for review. RNCM will follow back up with patient/family later this morning for decision.     Expected Discharge Plan: [place] Barriers to Discharge: No Barriers Identified  Expected Discharge Plan and Services Expected Discharge Plan: [place] arrangements for the past 2 months: Single Family Home                                       Social Determinants of Health (SDOH) Interventions    Readmission Risk Interventions No flowsheet data found.

## 2020-03-28 NOTE — Progress Notes (Signed)
Subjective: 5 Days Post-Op Procedure(s) (LRB): INTRAMEDULLARY (IM) NAIL INTERTROCHANTRIC (Left) Patient feeling better this morning.  She received a second unit of transfused blood yesterday.  Physical therapy was performed in bed. The patient has had a bowel movement. Plan is for discharge to SNF when appropriate. Negative for chest pain and shortness of breath Fever: no Gastrointestinal:Negative for nausea and vomiting  Objective: Vital signs in last 24 hours: Temp:  [98.1 F (36.7 C)-99 F (37.2 C)] 98.1 F (36.7 C) (03/02 0335) Pulse Rate:  [67-91] 72 (03/02 0335) Resp:  [14-20] 14 (03/02 0335) BP: (105-142)/(55-67) 138/67 (03/02 0335) SpO2:  [98 %-100 %] 98 % (03/02 0335)  Intake/Output from previous day:  Intake/Output Summary (Last 24 hours) at 03/28/2020 0635 Last data filed at 03/28/2020 0335 Gross per 24 hour  Intake 730.83 ml  Output 600 ml  Net 130.83 ml    Intake/Output this shift: Total I/O In: 410 [Blood:410] Out: 200 [Urine:200]  Labs: Recent Labs    03/25/20 1119 03/26/20 1013 03/27/20 0818  HGB 6.1* 7.6* 6.7*   Recent Labs    03/26/20 1013 03/27/20 0818  WBC 5.6 4.1  RBC 2.54* 2.25*  HCT 23.8* 21.0*  PLT 146* 129*   Recent Labs    03/26/20 1013 03/27/20 0818  NA 142 143  K 3.5 3.4*  CL 113* 115*  CO2 22 22  BUN 22 15  CREATININE 0.80 0.60  GLUCOSE 115* 99  CALCIUM 8.1* 7.9*   No results for input(s): LABPT, INR in the last 72 hours. EXAM General - Patient is more alert this morning and answering questions. Extremity -clean dry and intact dressing with no drainage.  Sensation intact.  Plantarflexion and dorsiflexion are intact. Motor Function - intact, moving foot and toes well on exam.  Past Medical History:  Diagnosis Date  . HTN (hypertension)     Assessment/Plan: 5 Days Post-Op Procedure(s) (LRB): INTRAMEDULLARY (IM) NAIL INTERTROCHANTRIC (Left) Active Problems:   Closed left hip fracture (HCC)   HTN (hypertension)    Fall   Normocytic anemia  Estimated body mass index is 20.37 kg/m as calculated from the following:   Height as of this encounter: 5\' 3"  (1.6 m).   Weight as of this encounter: 52.2 kg. Advance diet Up with therapy D/C IV fluids when tolerating po intake.  Acute blood loss anemia.  Hemoglobin 6.7 yesterday with a second unit of blood transfused.  Awaiting labs.  Continue to work with physical therapy. Avoid narcotics today and continue to monitor. Plan will be for discharge to SNF when appropriate.  Follow-up at [facility] clinic orthopedics in 2 weeks for staple removal and x-rays of the left femur  DVT Prophylaxis - Lovenox and SCDs Toe-touch weightbearing to the left leg.  Reche Dixon [facility] Orthopaedic Surgery 03/28/2020, 6:35 AM

## 2020-03-28 NOTE — TOC Progression Note (Signed)
Transition of Care [facility]) - Progression Note    Patient Details  Name: Laura Michael MRN: [id number] Date of Birth: 08/03/1934  Transition [facility]) CM/SW Contact  Shelbie Ammons, RN Phone Number: 03/28/2020, 1:53 PM  Clinical Narrative:   RNCM received communication from patient's son Vincente [place], they have chosen bed at [facility]. RNCM received insurance authorization through [place] portal with reference number of [id number] and authorization number of [id number]. Authorization is good 3/2 with next review date of 3/4.     Expected Discharge Plan: [place] Barriers to Discharge: No Barriers Identified  Expected Discharge Plan and Services Expected Discharge Plan: [place] arrangements for the past 2 months: Single Family Home                                       Social Determinants of Health (SDOH) Interventions    Readmission Risk Interventions No flowsheet data found.

## 2020-03-28 NOTE — Progress Notes (Signed)
Physical Therapy Treatment Patient Details Name: Laura Michael MRN: [id number] DOB: [birth year] Today's Date: 03/28/2020    History of Present Illness Laura Michael is a 86 y.o. female with medical history significant of anemia, hypertension, who presents with fall and left hip and knee pain. Pt diagnosed with left reverse obliquity hip fracture and is s/p intramedullary nailing of L femur with cephalomedullary device, pt is touchdown WB on LLE    PT Comments    Pt was pleasant and motivated to participate during the session and made good progress towards goals this session.  Pt only required min to mod A for LLE control only during sup to/from sit.  Pt required only min A and cues for sequencing to stand from an elevated surface with good WB compliance.  Pt was able to clear the floor while advancing her RLE during gait training with cues for proper sequencing.  Pt reported no increase in her baseline LLE pain of 7/10 during the session.  Pt will benefit from PT services in a SNF setting upon discharge to safely address deficits listed in patient problem list for decreased caregiver assistance and eventual return to PLOF.      Follow Up Recommendations  SNF     Equipment Recommendations  None recommended by PT    Recommendations for Other Services       Precautions / Restrictions Precautions Precautions: Fall Precaution Comments: LLE Foot Flat touchdown weightbearing Restrictions Weight Bearing Restrictions: Yes LLE Weight Bearing: Touchdown weight bearing Other Position/Activity Restrictions: TDWB on the LLE in foot flat position    Mobility  Bed Mobility Overal bed mobility: Needs Assistance Bed Mobility: Sit to Supine;Supine to Sit     Supine to sit: Mod assist;Min assist Sit to supine: Mod assist;Min assist   General bed mobility comments: Min to Mod A for LLE control    Transfers Overall transfer level: Needs assistance Equipment used: Rolling walker (2  wheeled) Transfers: Sit to/from Stand;Lateral/Scoot Transfers Sit to Stand: From elevated surface;Min assist        Lateral/Scoot Transfers: Supervision General transfer comment: Mod verbal and tactile cues for sequencing for WB compliance  Ambulation/Gait Ambulation/Gait assistance: Min assist Gait Distance (Feet): 1 Feet Assistive device: Rolling walker (2 wheeled) Gait Pattern/deviations: Step-to pattern;Trunk flexed Gait velocity: decreased   General Gait Details: Mod multi-modal cuing for proper sequencing for WB compliance with pt able to take 2-3 very small steps at the EOB but with R foot clearing the floor this session   Stairs             Wheelchair Mobility    Modified Rankin (Stroke Patients Only)       Balance Overall balance assessment: Needs assistance   Sitting balance-Leahy Scale: Good Sitting balance - Comments: Pt with good seated balance with unsupported sitting at EOB   Standing balance support: Bilateral upper extremity supported;During functional activity Standing balance-Leahy Scale: Fair Standing balance comment: Mod lean on the RW for support                            Cognition Arousal/Alertness: Awake/alert Behavior During Therapy: WFL for tasks assessed/performed Overall Cognitive Status: Within Functional Limits for tasks assessed                                        Exercises Total Joint Exercises  Ankle Circles/Pumps: AROM;Both;Strengthening;10 reps Quad Sets: Strengthening;Both;10 reps Gluteal Sets: 10 reps;Both;Strengthening Hip ABduction/ADduction: AAROM;10 reps;Strengthening;Both Straight Leg Raises: Strengthening;Both;AAROM;10 reps Long Arc Quad: AROM;10 reps;Strengthening;Both Knee Flexion: AROM;Strengthening;Both;10 reps    General Comments        Pertinent Vitals/Pain Pain Assessment: 0-10 Pain Score: 7  Pain Location: left knee and hip Pain Descriptors / Indicators:  Aching;Sore Pain Intervention(s): Premedicated before session;Monitored during session    Home Living                      Prior Function            PT Goals (current goals can now be found in the care plan section) Acute Rehab PT Goals Patient Stated Goal: to get better Progress towards PT goals: Progressing toward goals    Frequency    BID      PT Plan Current plan remains appropriate    Co-evaluation              AM-PAC PT "6 Clicks" Mobility   Outcome Measure  Help needed turning from your back to your side while in a flat bed without using bedrails?: A Little Help needed moving from lying on your back to sitting on the side of a flat bed without using bedrails?: A Little Help needed moving to and from a bed to a chair (including a wheelchair)?: A Lot Help needed standing up from a chair using your arms (e.g., wheelchair or bedside chair)?: A Little Help needed to walk in hospital room?: Total Help needed climbing 3-5 steps with a railing? : Total 6 Click Score: 13    End of Session Equipment Utilized During Treatment: Gait belt Activity Tolerance: Patient tolerated treatment well;No increased pain Patient left: in bed;with call bell/phone within reach;with bed alarm set;with family/visitor present Nurse Communication: Mobility status PT Visit Diagnosis: Unsteadiness on feet (R26.81);Muscle weakness (generalized) (M62.81);Difficulty in walking, not elsewhere classified (R26.2)     Time: 3710-6269 PT Time Calculation (min) (ACUTE ONLY): 26 min  Charges:  $Therapeutic Exercise: 8-22 mins $Therapeutic Activity: 8-22 mins                     D. Scott Peace Noyes PT, DPT 03/28/20, 5:19 PM

## 2020-03-28 NOTE — Progress Notes (Signed)
PROGRESS NOTE  Laura Michael QIW:[id number] DOB: 06/17/1934 DOA: 03/23/2020 PCP: System, Provider Not In  Brief History   86 year old woman presented after a fall resulting in hip and knee pain.  Admitted for left hip fracture. Status post left femur nail 2/25.  Tolerated procedure well.  Operatively has had acute blood loss anemia requiring multiple transfusions.  Had an episode of postoperative delirium which appears resolved.  At this point hemoglobin stable anticipate transfer 3/3 to SNF.  A & P  Closed hip fracture status post fall  --Status post operative repair 2/25 --Continue management per orthopedics  Acute blood loss anemia, perioperative --Transfused 1 unit PRBC 2/27 and 3/1 --Chemoprophylaxis for VTE on hold --CBC in a.m.  Postoperative delirium --Appears resolved at this point  Essential hypertension --Stable.  Continue Prinzide.  Fall at home --Follow-up with PCP as an outpatient   Disposition Plan:  Discussion: check CBC in AM, if stable then to SNF  Status is: Inpatient  Remains inpatient appropriate because:Inpatient level of care appropriate due to severity of illness   Dispo: The patient is from: Home              Anticipated d/c is to: SNF              Patient currently is not medically stable to d/c.   Difficult to place patient No  DVT prophylaxis: SCDs Start: 03/23/20 1733   Code Status: DNR Level of care: Med-Surg Family Communication: daughter-in-law at bedside  Murray Hodgkins, MD  [facility] Direct contact: see [email or web address] (further directions at bottom of note if needed) 7PM-7AM contact night coverage as at bottom of note 03/28/2020, 5:34 PM  LOS: 5 days   Significant Hospital Events   .    Consults:  .    Procedures:  .   Significant Diagnostic Tests:  Marland Kitchen    Micro Data:  .    Antimicrobials:  .   Interval History/Subjective  CC: f/u hip fx  Feels ok, poor appetite though, eating little  Objective   Vitals:   Vitals:   03/28/20 1123 03/28/20 1603  BP: (!) 129/59 136/60  Pulse: 65 67  Resp: 16 15  Temp: 98.6 F (37 C) 98.2 F (36.8 C)  SpO2: 98% 99%    Exam:  Constitutional:   . Appears calm and comfortable ENMT:  . grossly normal hearing  Respiratory:  . CTA bilaterally, no w/r/r.  . Respiratory effort normal.  Cardiovascular:  . RRR, no m/r/g . No LE extremity edema   Psychiatric:  . Mental status o Mood, affect appropriate  I have personally reviewed the following:   Today's Data  . Hgb up to 8.2 s/p 1 unit PRBC  Scheduled Meds: . acetaminophen  1,000 mg Oral Q8H  . Chlorhexidine Gluconate Cloth  6 each Topical Daily  . diclofenac Sodium  2 g Topical QID  . docusate sodium  100 mg Oral BID  . feeding supplement  237 mL Oral BID BM  . lisinopril  20 mg Oral Daily   And  . hydrochlorothiazide  25 mg Oral Daily  . melatonin  3 mg Oral QHS  . multivitamin with minerals  1 tablet Oral Daily   Continuous Infusions:  Principal Problem:   Closed left hip fracture (HCC) Active Problems:   HTN (hypertension)   Fall   Normocytic anemia   Acute blood loss anemia   LOS: 5 days   How to contact the [facility] Attending or Consulting provider 7A -  7P or covering provider during after hours [place], for this patient?  1. Check the care team in [facility] and look for a) attending/consulting TR[facility] provider listed and b) the [facility] team listed 2. Log into [email or web address] and use [place]'s universal password to access. If you do not have the password, please contact the hospital operator. 3. Locate the [facility] provider you are looking for under [facility] and page to a number that you can be directly reached. 4. If you still have difficulty reaching the provider, please page the [facility] (Director on Call) for the Hospitalists listed on amion for assistance.

## 2020-03-28 NOTE — Hospital Course (Signed)
86 year old woman presented after a fall resulting in hip and knee pain.  Admitted for left hip fracture. Status post left femur nail 2/25.  Tolerated procedure well.  Operatively has had acute blood loss anemia requiring multiple transfusions.  Had an episode of postoperative delirium which appears resolved.  At this point hemoglobin stable anticipate transfer 3/3 to SNF.  A & P  Closed hip fracture status post fall  --Status post operative repair 2/25 --Continue management per orthopedics  Acute blood loss anemia, perioperative --Transfused 1 unit PRBC 2/27 and 3/1 --Chemoprophylaxis for VTE on hold --CBC in a.m.  Postoperative delirium --Appears resolved at this point  Essential hypertension --Stable.  Continue Prinzide.  Fall at home --Follow-up with PCP as an outpatient

## 2020-03-28 NOTE — Progress Notes (Signed)
Occupational Therapy Treatment Patient Details Name: Laura Michael MRN: [id number] DOB: 1935-01-07 Today's Date: 03/28/2020    History of present illness Laura Michael is a 86 y.o. female with medical history significant of anemia, hypertension, who presents with fall and left hip and knee pain. Pt diagnosed with left reverse obliquity hip fracture and is s/p intramedullary nailing of L femur with cephalomedullary device, pt is touchdown WB on LLE   OT comments  Upon entering the room, pt supine in bed with family member present in the room. Pt with no c/o pain at rest but with mobility and standing tasks reports 8/10 pain in R LE. Pt required mod A to EOB and therapist reviewed weight bearing precautions of pt who verbalized understanding. Pt brushing hair while seated on EOB with set up A. Mod lifting assistance to stand from EOB. Pt taking 2 shuffling steps forwards towards sink to brush teeth. Pt leaning heavily on elbows on sink edge with cuing for precautions. She fatigued quickly and returned to bed at end of session. All needs within reach. Pt continues to benefit from OT intervention.    Follow Up Recommendations  SNF    Equipment Recommendations  Other (comment) (defer to next venue of care)       Precautions / Restrictions Precautions Precautions: Fall Precaution Comments: LLE Foot Flat touchdown weightbearing Restrictions Weight Bearing Restrictions: Yes Other Position/Activity Restrictions: TDWB on the LLE in foot flat position       Mobility Bed Mobility Overal bed mobility: Needs Assistance Bed Mobility: Sit to Supine;Supine to Sit     Supine to sit: Mod assist Sit to supine: Mod assist   General bed mobility comments: Mod A for BLE and trunk control    Transfers Overall transfer level: Needs assistance Equipment used: Rolling walker (2 wheeled) Transfers: Sit to/from Stand Sit to Stand: Mod assist         General transfer comment: mod lifting  assistance from standard height bed    Balance Overall balance assessment: Needs assistance   Sitting balance-Leahy Scale: Good Sitting balance - Comments: Pt with good seated balance with unsupported sitting at EOB   Standing balance support: Bilateral upper extremity supported;During functional activity Standing balance-Leahy Scale: Fair Standing balance comment: Mod lean on the RW for support                           ADL either performed or assessed with clinical judgement   ADL Overall ADL's : Needs assistance/impaired     Grooming: Standing;Minimal assistance Grooming Details (indicate cue type and reason): min A for balance standing at sink with min cuing for weight bearing precautions/technique                                     Vision Patient Visual Report: No change from baseline            Cognition Arousal/Alertness: Awake/alert Behavior During Therapy: WFL for tasks assessed/performed Overall Cognitive Status: Within Functional Limits for tasks assessed                           Pertinent Vitals/ Pain       Pain Score: 8  Pain Location: left knee and hip Pain Descriptors / Indicators: Aching;Sore Pain Intervention(s): Limited activity within patient's tolerance;Monitored during session;Premedicated before session;Repositioned  Prior Functioning/Environment              Frequency  Min 1X/week        Progress Toward Goals  OT Goals(current goals can now be found in the care plan section)  Progress towards OT goals: Progressing toward goals  Acute Rehab OT Goals Patient Stated Goal: to get better OT Goal Formulation: With patient/family Time For Goal Achievement: 04/07/20 Potential to Achieve Goals: Good  Plan Discharge plan remains appropriate       AM-PAC OT "6 Clicks" Daily Activity     Outcome Measure   Help from another person eating meals?: None Help from another person taking care of  personal grooming?: A Lot Help from another person toileting, which includes using toliet, bedpan, or urinal?: A Lot Help from another person bathing (including washing, rinsing, drying)?: A Lot Help from another person to put on and taking off regular upper body clothing?: A Little Help from another person to put on and taking off regular lower body clothing?: A Lot 6 Click Score: 15    End of Session Equipment Utilized During Treatment: Rolling walker  OT Visit Diagnosis: Unsteadiness on feet (R26.81);Other abnormalities of gait and mobility (R26.89);Muscle weakness (generalized) (M62.81);Pain Pain - Right/Left: Left Pain - part of body: Knee;Leg   Activity Tolerance Patient tolerated treatment well   Patient Left in bed;with call bell/phone within reach;with bed alarm set;with family/visitor present   Nurse Communication          Time: 8867-7373 OT Time Calculation (min): 23 min  Charges: OT General Charges $OT Visit: 1 Visit OT Treatments $Self Care/Home Management : 23-37 mins  Laura Crocker, MS, OTR/L , CBIS ascom [phone]  03/28/20, 4:02 PM

## 2020-03-28 NOTE — Plan of Care (Signed)
  Problem: Health Behavior/Discharge Planning: Goal: Ability to manage health-related needs will improve Outcome: Progressing   Problem: Clinical Measurements: Goal: Ability to maintain clinical measurements within normal limits will improve Outcome: Progressing Goal: Will remain free from infection Outcome: Progressing Goal: Diagnostic test results will improve Outcome: Progressing Goal: Respiratory complications will improve Outcome: Progressing Goal: Cardiovascular complication will be avoided Outcome: Progressing   Problem: Activity: Goal: Risk for activity intolerance will decrease Outcome: Progressing   Problem: Nutrition: Goal: Adequate nutrition will be maintained Outcome: Progressing   Problem: Coping: Goal: Level of anxiety will decrease Outcome: Progressing   Problem: Pain Managment: Goal: General experience of comfort will improve Outcome: Progressing   Problem: Safety: Goal: Ability to remain free from injury will improve Outcome: Progressing   Problem: Skin Integrity: Goal: Risk for impaired skin integrity will decrease Outcome: Progressing   

## 2020-03-29 DIAGNOSIS — D696 Thrombocytopenia, unspecified: Secondary | ICD-10-CM

## 2020-03-29 DIAGNOSIS — E440 Moderate protein-calorie malnutrition: Secondary | ICD-10-CM | POA: Diagnosis not present

## 2020-03-29 DIAGNOSIS — D62 Acute posthemorrhagic anemia: Secondary | ICD-10-CM | POA: Diagnosis not present

## 2020-03-29 DIAGNOSIS — S72002A Fracture of unspecified part of neck of left femur, initial encounter for closed fracture: Secondary | ICD-10-CM | POA: Diagnosis not present

## 2020-03-29 LAB — RESP PANEL BY RT-PCR (FLU A&B, COVID) ARPGX2
Influenza A by PCR: NEGATIVE
Influenza B by PCR: NEGATIVE
SARS Coronavirus 2 by RT PCR: NEGATIVE

## 2020-03-29 LAB — CBC
HCT: 30.8 % — ABNORMAL LOW (ref 36.0–46.0)
Hemoglobin: 9.4 g/dL — ABNORMAL LOW (ref 12.0–15.0)
MCH: 29.9 pg (ref 26.0–34.0)
MCHC: 30.5 g/dL (ref 30.0–36.0)
MCV: 98.1 fL (ref 80.0–100.0)
Platelets: 120 10*3/uL — ABNORMAL LOW (ref 150–400)
RBC: 3.14 MIL/uL — ABNORMAL LOW (ref 3.87–5.11)
RDW: 15.8 % — ABNORMAL HIGH (ref 11.5–15.5)
WBC: 4.6 10*3/uL (ref 4.0–10.5)
nRBC: 0 % (ref 0.0–0.2)

## 2020-03-29 MED ORDER — ADULT MULTIVITAMIN W/MINERALS CH: 1.0000 | ORAL_TABLET | Freq: Every day | ORAL | Status: DC

## 2020-03-29 MED ORDER — ACETAMINOPHEN 325 MG PO TABS
650.0000 mg | ORAL_TABLET | Freq: Four times a day (QID) | ORAL | Status: AC | PRN
Start: 2020-03-29 — End: ?

## 2020-03-29 NOTE — Care Management Important Message (Signed)
Important Message  Patient Details  Name: Laura Michael MRN: [id number] Date of Birth: 03-27-1934   Medicare Important Message Given:  Yes     Juliann Pulse A Kristen Fromm 03/29/2020, 12:51 PM

## 2020-03-29 NOTE — Plan of Care (Signed)
  Problem: Clinical Measurements: ?Goal: Ability to maintain clinical measurements within normal limits will improve ?Outcome: Progressing ?Goal: Will remain free from infection ?Outcome: Progressing ?Goal: Diagnostic test results will improve ?Outcome: Progressing ?Goal: Respiratory complications will improve ?Outcome: Progressing ?Goal: Cardiovascular complication will be avoided ?Outcome: Progressing ?  ?Problem: Nutrition: ?Goal: Adequate nutrition will be maintained ?Outcome: Progressing ?  ?Problem: Elimination: ?Goal: Will not experience complications related to bowel motility ?Outcome: Progressing ?Goal: Will not experience complications related to urinary retention ?Outcome: Progressing ?  ?Problem: Safety: ?Goal: Ability to remain free from injury will improve ?Outcome: Progressing ?  ?Problem: Skin Integrity: ?Goal: Risk for impaired skin integrity will decrease ?Outcome: Progressing ?  ?

## 2020-03-29 NOTE — Progress Notes (Signed)
Physical Therapy Treatment Patient Details Name: Laura Michael MRN: [id number] DOB: Jun 29, 1934 Today's Date: 03/29/2020    History of Present Illness Laura Michael is a 86 y.o. female with medical history significant of anemia, hypertension, who presents with fall and left hip and knee pain. Pt diagnosed with left reverse obliquity hip fracture and is s/p intramedullary nailing of L femur with cephalomedullary device, pt is touchdown WB on LLE    PT Comments    Pt was pleasant and motivated to participate during the session.  Pt required only minimal support/assistance managing her LLE during sup to/from sit with good effort throughout.  Pt was able to stand with good WB compliance with cues for sequencing but was unable to clear the surface of the floor when attempting to advance her RLE.  Pt was able to take several very small shuffling steps and was able to laterally scoot at the EOB with cues for LLE WB compliance.  Pt will benefit from PT services in a SNF setting upon discharge to safely address deficits listed in patient problem list for decreased caregiver assistance and eventual return to PLOF.     Follow Up Recommendations  SNF     Equipment Recommendations  None recommended by PT    Recommendations for Other Services       Precautions / Restrictions Precautions Precautions: Fall Precaution Comments: LLE Foot Flat touchdown weightbearing Restrictions Weight Bearing Restrictions: Yes LLE Weight Bearing: Touchdown weight bearing Other Position/Activity Restrictions: TDWB on the LLE in foot flat position    Mobility  Bed Mobility Overal bed mobility: Needs Assistance Bed Mobility: Sit to Supine;Supine to Sit     Supine to sit: Min assist Sit to supine: Min assist   General bed mobility comments: Min A for LLE control    Transfers Overall transfer level: Needs assistance Equipment used: Rolling walker (2 wheeled) Transfers: Sit to/from Stand;Lateral/Scoot  Transfers Sit to Stand: From elevated surface;Min assist        Lateral/Scoot Transfers: Supervision General transfer comment: Mod verbal and tactile cues for sequencing for WB compliance  Ambulation/Gait Ambulation/Gait assistance: Min assist Gait Distance (Feet): 1 Feet Assistive device: Rolling walker (2 wheeled) Gait Pattern/deviations: Shuffle Gait velocity: decreased   General Gait Details: Pt only able to shuffle her RLE on the floor this session   Stairs             Wheelchair Mobility    Modified Rankin (Stroke Patients Only)       Balance Overall balance assessment: Needs assistance   Sitting balance-Leahy Scale: Good     Standing balance support: Bilateral upper extremity supported;During functional activity Standing balance-Leahy Scale: Fair Standing balance comment: Mod lean on the RW for support                            Cognition Arousal/Alertness: Awake/alert Behavior During Therapy: WFL for tasks assessed/performed Overall Cognitive Status: Within Functional Limits for tasks assessed                                        Exercises Total Joint Exercises Ankle Circles/Pumps: AROM;Both;Strengthening;10 reps (with manual resistance) Quad Sets: Strengthening;Both;10 reps Gluteal Sets: 10 reps;Both;Strengthening Hip ABduction/ADduction: AAROM;10 reps;Strengthening;Both Straight Leg Raises: Strengthening;Both;AAROM;10 reps Long Arc Quad: AROM;10 reps;Strengthening;Both Knee Flexion: AROM;Strengthening;Both;10 reps    General Comments  Pertinent Vitals/Pain Pain Assessment: 0-10 Pain Score: 7  Pain Location: left knee and hip Pain Descriptors / Indicators: Aching;Sore Pain Intervention(s): Premedicated before session;Monitored during session    Home Living                      Prior Function            PT Goals (current goals can now be found in the care plan section) Progress towards  PT goals: Progressing toward goals    Frequency    BID      PT Plan Current plan remains appropriate    Co-evaluation              AM-PAC PT "6 Clicks" Mobility   Outcome Measure  Help needed turning from your back to your side while in a flat bed without using bedrails?: A Little Help needed moving from lying on your back to sitting on the side of a flat bed without using bedrails?: A Little Help needed moving to and from a bed to a chair (including a wheelchair)?: A Lot Help needed standing up from a chair using your arms (e.g., wheelchair or bedside chair)?: A Little Help needed to walk in hospital room?: Total Help needed climbing 3-5 steps with a railing? : Total 6 Click Score: 13    End of Session Equipment Utilized During Treatment: Gait belt Activity Tolerance: Patient tolerated treatment well Patient left: in bed;with call bell/phone within reach;with bed alarm set;with family/visitor present;with nursing/sitter in room Nurse Communication: Mobility status PT Visit Diagnosis: Unsteadiness on feet (R26.81);Muscle weakness (generalized) (M62.81);Difficulty in walking, not elsewhere classified (R26.2)     Time: 1007-1030 PT Time Calculation (min) (ACUTE ONLY): 23 min  Charges:  $Therapeutic Exercise: 8-22 mins $Therapeutic Activity: 8-22 mins                     D. Scott Laura Michael PT, DPT 03/29/20, 11:30 AM

## 2020-03-29 NOTE — Progress Notes (Signed)
Nutrition Follow-up  DOCUMENTATION CODES:   Non-severe (moderate) malnutrition in context of social or environmental circumstances  INTERVENTION:  Will discontinue Ensure Enlive per patient request.  Provide Magic cup TID with meals, each supplement provides 290 kcal and 9 grams of protein. Patient prefers vanilla.  Provide snacks po BID between meals (ordered by RD in [employer]).  Continue MVI po daily.  Consider addition of appetite stimulant if medically appropriate.  NUTRITION DIAGNOSIS:   Moderate Malnutrition related to social / environmental circumstances (inadequate oral intake) as evidenced by mild fat depletion,mild muscle depletion.  New nutrition diagnosis.  GOAL:   Patient will meet greater than or equal to 90% of their needs  Not met.  MONITOR:   PO intake,Supplement acceptance,Labs,Weight trends,I & O's  REASON FOR ASSESSMENT:   Malnutrition Screening Tool,Consult Assessment of nutrition requirement/status  ASSESSMENT:   86 year old female with PMHx of HTN who presents after a fall found to have left reverse obliquity intertrochanteric hip fracture.   2/25 s/p IM nailing of left femur with cephalomedullary device  Met with patient and her son at bedside. She reports she does not eat a lot of food at baseline but her appetite and intake have been decreased since her fall. At home she typically has toast for breakfast and then will have another meal later in the day around 3pm that may be salad or soup. She also enjoys snacking on peanut butter crackers. Patient has had variable PO intake (0-100%). Yesterday she ate 30-50% of her meals and drank 1/2 of an Ensure. That is approximately 570 kcal (41% minimum estimated needs) and 23 grams of protein (33% minimum estimated needs). She reports she does not like the Ensure and would rather try vanilla Magic Cup. RD will also order snacks between meals. Patient did not have a menu in her room. Brought patient a  menu and reviewed items on menu she likes that are high in calories and protein. Encouraged adequate intake of calories and protein to meet estimated needs and promote healing.  Medications reviewed and include: Colace 100 mg BID, lisinopril, hydrochlorothiazide, MVI daily.  Labs reviewed.  I/O: 1125 mL UOP Yesterday (0.9 mL/kg/hr)  No weight to trend since 2/25  NUTRITION - FOCUSED PHYSICAL EXAM:  Flowsheet Row Most Recent Value  Orbital Region Mild depletion  Upper Arm Region Moderate depletion  Thoracic and Lumbar Region No depletion  Buccal Region Mild depletion  Temple Region Mild depletion  Clavicle Bone Region Mild depletion  Clavicle and Acromion Bone Region Moderate depletion  Scapular Bone Region Mild depletion  Dorsal Hand Moderate depletion  Patellar Region No depletion  Anterior Thigh Region No depletion  Posterior Calf Region Mild depletion  Edema (RD Assessment) None  Hair Reviewed  Eyes Reviewed  Mouth Reviewed  Skin Reviewed  Nails Reviewed     Diet Order:   Diet Order            Diet regular Room service appropriate? Yes; Fluid consistency: Thin  Diet effective now                EDUCATION NEEDS:   Education needs have been addressed  Skin:  Skin Assessment:  (RN skin assessment not documented at this time)  Last BM:  Unknown  Height:   Ht Readings from Last 1 Encounters:  03/23/20 5' 3"  (1.6 m)   Weight:   Wt Readings from Last 1 Encounters:  03/23/20 52.2 kg   BMI:  Body mass index is 20.37 kg/m.  Estimated Nutritional Needs:   Kcal:  1400-1600  Protein:  70-80 grams  Fluid:  1.4-1.6 L/day  Jacklynn Barnacle, MS, RD, LDN Pager number available on Amion

## 2020-03-29 NOTE — Discharge Summary (Signed)
Physician Discharge Summary  Laura Michael JOI:[id number] DOB: 1934/02/06 DOA: 03/23/2020  PCP: System, Provider Not In  Admit date: 03/23/2020 Discharge date: 03/29/2020  Recommendations for Outpatient Follow-up:   Closed hip fracture status post fall  --Status post operative repair 2/25 --Continue management per orthopedics --Follow-up at [facility] clinic orthopedics in 2 weeks for staple removal and x-rays of the left femur --DVT Prophylaxis - Lovenox and SCDs per ortho Dr. Posey Pronto --Toe-touch weightbearing to the left leg.  Acute blood loss anemia, perioperative --Transfused 1 unit PRBC 2/27 and 3/1 --Hgb stable s/p transition, increased today, stable for discharge. --recommend check cbc 3/5 and 3/10  Mild thrombocytopenia --secondary to blood loss --expect spontaneous recovery --check CBC in 1 week    Contact information for follow-up providers    Reche Dixon, PA-C Follow up in 2 week(s).   Specialty: Orthopedic Surgery Why: For staple removal and left femur x-rays Contact information: [address] [place] [address] [phone]            Contact information for after-discharge care    Destination    [facility] .   Service: Skilled Nursing Contact information: [address] [place] [phone]                   Discharge Diagnoses: Principal diagnosis is #1 Principal Problem:   Closed left hip fracture ([place]) Active Problems:   HTN (hypertension)   Fall   Normocytic anemia   Acute blood loss anemia   Malnutrition of moderate degree  Discharge Condition: improved Disposition: short-term SNF  Diet recommendation:  Diet Orders (From admission, onward)    Start     Ordered   03/29/20 0000  Diet general        03/29/20 1122   03/23/20 1733  Diet regular Room service appropriate? Yes; Fluid consistency: Thin  Diet effective now       Question Answer  Comment  Room service appropriate? Yes   Fluid consistency: Thin      03/23/20 1732           Filed Weights   03/23/20 0626  Weight: 52.2 kg    HPI/Hospital Course:   86 year old woman presented after a fall resulting in hip and knee pain.  Admitted for left hip fracture. Status post left femur nail 2/25.  Tolerated procedure well.  Post-operatively has had acute blood loss anemia requiring multiple transfusions.  Had an episode of postoperative delirium which appears resolved. Hgb now stable. D/c to short-term SNF.  Closed hip fracture status post fall  --Status post operative repair 2/25 --Continue management per orthopedics --Follow-up at [facility] clinic orthopedics in 2 weeks for staple removal and x-rays of the left femur --DVT Prophylaxis - Lovenox and SCDs per ortho Dr. Posey Pronto --Toe-touch weightbearing to the left leg.  Acute blood loss anemia, perioperative --Transfused 1 unit PRBC 2/27 and 3/1 --Hgb stable s/p transition, increased today, stable for discharge. --recommend check cbc 3/5 and 3/10  Mild thrombocytopenia --secondary to blood loss --expect spontaneous recovery --check CBC in 1 week  Postoperative delirium --resolved   Essential hypertension --Stable.  Continue Prinzide.  Fall at home --Follow-up with PCP as an outpatient  Moderate malnutrition --Provide Magic cup TID with meals, each supplement provides 290 kcal and 9 grams of protein. Patient prefers vanilla.  Provide snacks po BID between meals  Continue MVI po daily.   Today's assessment: S: CC: f/u hip fx  Feels  fine today, eating ok, some pain but overall ok.  O: Vitals:  Vitals:   03/29/20 0447 03/29/20 0807  BP: (!) 146/74 (!) 145/76  Pulse: 65 74  Resp: 14 18  Temp: 98.2 F (36.8 C) 98.3 F (36.8 C)  SpO2: 99% 99%    Constitutional:  . Appears calm and comfortable ENMT:  . grossly normal hearing  Respiratory:  . CTA bilaterally, no w/r/r.  . Respiratory effort  normal. Cardiovascular:  . RRR, no m/r/g . No significant LE extremity edema   Musculoskeletal:  . RLE, LLE   . Moves both extremities to command Psychiatric:  . Mental status o Mood, affect appropriate  Hgb sup to 9.4 Plts stable 120  Discharge Instructions  Discharge Instructions    Call MD for:   Complete by: As directed    Call MD for:  difficulty breathing, headache or visual disturbances   Complete by: As directed    Call MD for:  persistant nausea and vomiting   Complete by: As directed    Call MD for:  redness, tenderness, or signs of infection (pain, swelling, redness, odor or green/yellow discharge around incision site)   Complete by: As directed    Call MD for:  severe uncontrolled pain   Complete by: As directed    Diet general   Complete by: As directed    Discharge instructions   Complete by: As directed    Call your physician or seek immediate medical attention for fever, pain or worsening of condition.   Discharge wound care:   Complete by: As directed    Reinforce dressing, keep dry     Allergies as of 03/29/2020   No Known Allergies     Medication List    TAKE these medications   acetaminophen 325 MG tablet Commonly known as: TYLENOL Take 2 tablets (650 mg total) by mouth every 6 (six) hours as needed for mild pain, fever or headache.   enoxaparin 40 MG/0.4ML injection Commonly known as: LOVENOX Inject 0.4 mLs (40 mg total) into the skin daily for 14 days.   lisinopril-hydrochlorothiazide 20-25 MG tablet Commonly known as: ZESTORETIC Take 1 tablet by mouth daily.   multivitamin with minerals Tabs tablet Take 1 tablet by mouth daily. Start taking on: March 30, 2020   oxyCODONE 5 MG immediate release tablet Commonly known as: Oxy IR/ROXICODONE Take 0.5-1 tablets (2.5-5 mg total) by mouth every 6 (six) hours as needed for moderate pain or severe pain.            Discharge Care Instructions  (From admission, onward)         Start      Ordered   03/29/20 0000  Discharge wound care:       Comments: Reinforce dressing, keep dry   03/29/20 1122         No Known Allergies  The results of significant diagnostics from this hospitalization (including imaging, microbiology, ancillary and laboratory) are listed below for reference.    Significant Diagnostic Studies: DG Chest 1 View  Result Date: 03/23/2020 CLINICAL DATA:  BB patient fell going to the bathroom this morning. Pain in the knee. EXAM: CHEST  1 VIEW COMPARISON:  None FINDINGS: Heart size is normal. The aorta is tortuous and partially calcified. There are no focal consolidations or pleural effusions. No pulmonary edema. Minimal bibasilar atelectasis. No pneumothorax or acute fracture. No free intraperitoneal air. Chronic changes in both shoulders. IMPRESSION: Minimal bibasilar atelectasis. Aortic atherosclerosis.  (ICD10-I70.0) Electronically Signed  By: Nolon Nations M.D.   On: 03/23/2020 08:30   CT HEAD WO CONTRAST  Result Date: 03/23/2020 CLINICAL DATA:  86 year old female status post fall landing on left side. EXAM: CT HEAD WITHOUT CONTRAST TECHNIQUE: Contiguous axial images were obtained from the base of the skull through the vertex without intravenous contrast. COMPARISON:  None. FINDINGS: Brain: Cerebral volume is within normal limits for age. No midline shift, ventriculomegaly, mass effect, evidence of mass lesion, intracranial hemorrhage or evidence of cortically based acute infarction. Tiny chronic lacunar infarct suspected in the left cerebellum on series 3, image 9. Otherwise gray-white matter differentiation is within normal limits for age. No cortical encephalomalacia identified. Vascular: Mild Calcified atherosclerosis at the skull base. No suspicious intracranial vascular hyperdensity. Skull: No fracture identified.  Osteopenia at the skull base. Sinuses/Orbits: Visualized paranasal sinuses and mastoids are clear. Other: Visualized orbits and scalp soft  tissues are within normal limits. IMPRESSION: No acute traumatic injury identified. Largely negative for age non contrast CT appearance of the brain. Electronically Signed   By: Genevie Ann M.D.   On: 03/23/2020 10:35   DG Knee Complete 4 Views Left  Result Date: 03/23/2020 CLINICAL DATA:  86 year old female status post fall at home. Pain and decreased range of motion. EXAM: LEFT KNEE - COMPLETE 4+ VIEW COMPARISON:  Report of left knee series 10/02/1998 (no images available). FINDINGS: Osteopenia. Degenerative spurring at the left knee, perhaps most pronounced in the patellofemoral compartment. Patella appears to remain intact. Trace if any joint effusion. Mild medial and lateral compartment joint space loss with possible mild chondrocalcinosis. No acute fracture or dislocation identified. Mild calcified peripheral vascular disease. IMPRESSION: Osteopenia and degenerative changes. No acute fracture or dislocation identified about the left knee. Electronically Signed   By: Genevie Ann M.D.   On: 03/23/2020 07:24   DG HIP OPERATIVE UNILAT W OR W/O PELVIS LEFT  Result Date: 03/23/2020 CLINICAL DATA:  Surgery EXAM: OPERATIVE left HIP (WITH PELVIS IF PERFORMED) 4 VIEWS TECHNIQUE: Fluoroscopic spot image(s) were submitted for interpretation post-operatively. COMPARISON:  03/23/2020 FINDINGS: Four low resolution intraoperative spot views of the left hip. Total fluoroscopy time was 5 minutes 42 seconds. The images demonstrate intramedullary rodding and distal screw fixation of left femur for intertrochanteric hip fracture. IMPRESSION: Intraoperative fluoroscopic assistance provided during left hip surgery. Electronically Signed   By: Donavan Foil M.D.   On: 03/23/2020 17:13   DG HIP UNILAT WITH PELVIS 2-3 VIEWS LEFT  Result Date: 03/23/2020 CLINICAL DATA:  Fall, hip pain EXAM: DG HIP (WITH OR WITHOUT PELVIS) 2-3V LEFT COMPARISON:  None. FINDINGS: There is an acute angulated displaced left hip intertrochanteric  fracture. Bones are osteopenic. Advanced degenerative arthropathy of the left hip with acetabular protrusio and sclerosis. Bony pelvis and right hip appear intact. IMPRESSION: Acute angulated displaced left hip intertrochanteric fracture. Osteopenia and degenerative changes Electronically Signed   By: Jerilynn Mages.  Shick M.D.   On: 03/23/2020 08:33    Microbiology: Recent Results (from the past 240 hour(s))  Resp Panel by RT-PCR (Flu A&B, Covid) Nasopharyngeal Swab     Status: None   Collection Time: 03/23/20  8:40 AM   Specimen: Nasopharyngeal Swab; Nasopharyngeal(NP) swabs in vial transport medium  Result Value Ref Range Status   SARS Coronavirus 2 by RT PCR NEGATIVE NEGATIVE Final    Comment: (NOTE) SARS-CoV-2 target nucleic acids are NOT DETECTED.  The SARS-CoV-2 RNA is generally detectable in upper respiratory specimens during the acute phase of infection. The lowest concentration of SARS-CoV-2  viral copies this assay can detect is 138 copies/mL. A negative result does not preclude SARS-Cov-2 infection and should not be used as the sole basis for treatment or other patient management decisions. A negative result may occur with  improper specimen collection/handling, submission of specimen other than nasopharyngeal swab, presence of viral mutation(s) within the areas targeted by this assay, and inadequate number of viral copies(<138 copies/mL). A negative result must be combined with clinical observations, patient history, and epidemiological information. The expected result is Negative.  Fact Sheet for Patients:  EntrepreneurPulse.com.au  Fact Sheet for Healthcare Providers:  IncredibleEmployment.be  This test is no t yet approved or cleared by the [place] FDA and  has been authorized for detection and/or diagnosis of SARS-CoV-2 by FDA under an Emergency Use Authorization (EUA). This EUA will remain  in effect (meaning this test can be used) for  the duration of the COVID-19 declaration under Section 564(b)(1) of the Act, 21 U.S.C.section 360bbb-3(b)(1), unless the authorization is terminated  or revoked sooner.       Influenza A by PCR NEGATIVE NEGATIVE Final   Influenza B by PCR NEGATIVE NEGATIVE Final    Comment: (NOTE) The Xpert Xpress SARS-CoV-2/FLU/RSV plus assay is intended as an aid in the diagnosis of influenza from Nasopharyngeal swab specimens and should not be used as a sole basis for treatment. Nasal washings and aspirates are unacceptable for Xpert Xpress SARS-CoV-2/FLU/RSV testing.  Fact Sheet for Patients: EntrepreneurPulse.com.au  Fact Sheet for Healthcare Providers: IncredibleEmployment.be  This test is not yet approved or cleared by the [place] FDA and has been authorized for detection and/or diagnosis of SARS-CoV-2 by FDA under an Emergency Use Authorization (EUA). This EUA will remain in effect (meaning this test can be used) for the duration of the COVID-19 declaration under Section 564(b)(1) of the Act, 21 U.S.C. section 360bbb-3(b)(1), unless the authorization is terminated or revoked.  Performed at [facility], [address] [place], [place] [address]   Resp Panel by RT-PCR (Flu A&B, Covid) Nasopharyngeal Swab     Status: None   Collection Time: 03/29/20  8:57 AM   Specimen: Nasopharyngeal Swab; Nasopharyngeal(NP) swabs in vial transport medium  Result Value Ref Range Status   SARS Coronavirus 2 by RT PCR NEGATIVE NEGATIVE Final    Comment: (NOTE) SARS-CoV-2 target nucleic acids are NOT DETECTED.  The SARS-CoV-2 RNA is generally detectable in upper respiratory specimens during the acute phase of infection. The lowest concentration of SARS-CoV-2 viral copies this assay can detect is 138 copies/mL. A negative result does not preclude SARS-Cov-2 infection and should not be used as the sole basis for treatment or other patient management  decisions. A negative result may occur with  improper specimen collection/handling, submission of specimen other than nasopharyngeal swab, presence of viral mutation(s) within the areas targeted by this assay, and inadequate number of viral copies(<138 copies/mL). A negative result must be combined with clinical observations, patient history, and epidemiological information. The expected result is Negative.  Fact Sheet for Patients:  EntrepreneurPulse.com.au  Fact Sheet for Healthcare Providers:  IncredibleEmployment.be  This test is no t yet approved or cleared by the [place] FDA and  has been authorized for detection and/or diagnosis of SARS-CoV-2 by FDA under an Emergency Use Authorization (EUA). This EUA will remain  in effect (meaning this test can be used) for the duration of the COVID-19 declaration under Section 564(b)(1) of the Act, 21 U.S.C.section 360bbb-3(b)(1), unless the authorization is terminated  or revoked sooner.  Influenza A by PCR NEGATIVE NEGATIVE Final   Influenza B by PCR NEGATIVE NEGATIVE Final    Comment: (NOTE) The Xpert Xpress SARS-CoV-2/FLU/RSV plus assay is intended as an aid in the diagnosis of influenza from Nasopharyngeal swab specimens and should not be used as a sole basis for treatment. Nasal washings and aspirates are unacceptable for Xpert Xpress SARS-CoV-2/FLU/RSV testing.  Fact Sheet for Patients: EntrepreneurPulse.com.au  Fact Sheet for Healthcare Providers: IncredibleEmployment.be  This test is not yet approved or cleared by the [place] FDA and has been authorized for detection and/or diagnosis of SARS-CoV-2 by FDA under an Emergency Use Authorization (EUA). This EUA will remain in effect (meaning this test can be used) for the duration of the COVID-19 declaration under Section 564(b)(1) of the Act, 21 U.S.C. section 360bbb-3(b)(1), unless the  authorization is terminated or revoked.  Performed at [facility], [address] [place], [place] [address]      Labs: Basic Metabolic Panel: Recent Labs  Lab 03/24/20 0546 03/25/20 1119 03/26/20 1013 03/27/20 0818 03/28/20 0612  NA 139 140 142 143 141  K 4.0 3.6 3.5 3.4* 3.5  CL 108 111 113* 115* 111  CO2 23 21* 22 22 24   GLUCOSE 118* 109* 115* 99 96  BUN 23 28* 22 15 13   CREATININE 0.85 0.94 0.80 0.60 0.58  CALCIUM 8.0* 8.0* 8.1* 7.9* 8.1*   CBC: Recent Labs  Lab 03/23/20 0629 03/24/20 0546 03/25/20 1119 03/26/20 1013 03/27/20 0818 03/28/20 0612 03/29/20 0702  WBC 5.9   < > 5.5 5.6 4.1 4.6 4.6  NEUTROABS 4.1  --   --  4.4 2.9 3.0  --   HGB 10.3*   < > 6.1* 7.6* 6.7* 8.2* 9.4*  HCT 33.5*   < > 19.8* 23.8* 21.0* 25.8* 30.8*  MCV 96.0   < > 95.7 93.7 93.3 93.5 98.1  PLT 171   < > 152 146* 129* 135* 120*   < > = values in this interval not displayed.    Principal Problem:   Closed left hip fracture (HCC) Active Problems:   HTN (hypertension)   Fall   Normocytic anemia   Acute blood loss anemia   Malnutrition of moderate degree   Time coordinating discharge: 35 minutes  Signed:  Murray Hodgkins, MD  [facility]  03/29/2020, 11:43 AM

## 2020-03-29 NOTE — Progress Notes (Signed)
Subjective: 6 Days Post-Op Procedure(s) (LRB): INTRAMEDULLARY (IM) NAIL INTERTROCHANTRIC (Left) Patient feeling better this morning.  More alert. The patient has had a bowel movement. Plan is for discharge to SNF when appropriate. Negative for chest pain and shortness of breath Fever: no Gastrointestinal:Negative for nausea and vomiting  Objective: Vital signs in last 24 hours: Temp:  [98.2 F (36.8 C)-98.9 F (37.2 C)] 98.2 F (36.8 C) (03/03 0447) Pulse Rate:  [61-74] 65 (03/03 0447) Resp:  [14-20] 14 (03/03 0447) BP: (129-146)/(59-76) 146/74 (03/03 0447) SpO2:  [97 %-99 %] 99 % (03/03 0447)  Intake/Output from previous day:  Intake/Output Summary (Last 24 hours) at 03/29/2020 0630 Last data filed at 03/29/2020 0109 Gross per 24 hour  Intake 240 ml  Output 525 ml  Net -285 ml    Intake/Output this shift: Total I/O In: -  Out: 525 [Urine:525]  Labs: Recent Labs    03/26/20 1013 03/27/20 0818 03/28/20 0612  HGB 7.6* 6.7* 8.2*   Recent Labs    03/27/20 0818 03/28/20 0612  WBC 4.1 4.6  RBC 2.25* 2.76*  HCT 21.0* 25.8*  PLT 129* 135*   Recent Labs    03/27/20 0818 03/28/20 0612  NA 143 141  K 3.4* 3.5  CL 115* 111  CO2 22 24  BUN 15 13  CREATININE 0.60 0.58  GLUCOSE 99 96  CALCIUM 7.9* 8.1*   No results for input(s): LABPT, INR in the last 72 hours. EXAM General - Patient is more alert this morning and answering questions. Extremity -clean dry and intact dressing with blood-tinged drainage.  Sensation intact.  Plantarflexion and dorsiflexion are intact. Motor Function - intact, moving foot and toes well on exam.  Past Medical History:  Diagnosis Date  . HTN (hypertension)     Assessment/Plan: 6 Days Post-Op Procedure(s) (LRB): INTRAMEDULLARY (IM) NAIL INTERTROCHANTRIC (Left) Principal Problem:   Closed left hip fracture (HCC) Active Problems:   HTN (hypertension)   Fall   Normocytic anemia   Acute blood loss anemia  Estimated body mass  index is 20.37 kg/m as calculated from the following:   Height as of this encounter: 5\' 3"  (1.6 m).   Weight as of this encounter: 52.2 kg. Advance diet Up with therapy D/C IV fluids when tolerating po intake.  Acute blood loss anemia.  Hemoglobin 8.2   Continue to work with physical therapy. Avoid narcotics today and continue to monitor. Plan will be for discharge to SNF when appropriate.  Follow-up at [facility] clinic orthopedics in 2 weeks for staple removal and x-rays of the left femur  DVT Prophylaxis - Lovenox and SCDs Toe-touch weightbearing to the left leg.  Laura Michael [facility] 03/29/2020, 6:30 AM

## 2020-05-12 ENCOUNTER — Emergency Department: Payer: Medicare HMO

## 2020-05-12 ENCOUNTER — Other Ambulatory Visit: Payer: Self-pay

## 2020-05-12 ENCOUNTER — Emergency Department
Admission: EM | Admit: 2020-05-12 | Discharge: 2020-05-12 | Disposition: A | Discharge status: Home / Self Care | Payer: Medicare HMO | Attending: Emergency Medicine | Admitting: Emergency Medicine

## 2020-05-12 ENCOUNTER — Other Ambulatory Visit: Payer: Self-pay | Admitting: Urology

## 2020-05-12 DIAGNOSIS — Z79899 Other long term (current) drug therapy: Secondary | ICD-10-CM | POA: Diagnosis not present

## 2020-05-12 DIAGNOSIS — M25551 Pain in right hip: Secondary | ICD-10-CM | POA: Insufficient documentation

## 2020-05-12 DIAGNOSIS — R519 Headache, unspecified: Secondary | ICD-10-CM | POA: Insufficient documentation

## 2020-05-12 DIAGNOSIS — D181 Lymphangioma, any site: Secondary | ICD-10-CM | POA: Diagnosis not present

## 2020-05-12 DIAGNOSIS — Q6211 Congenital occlusion of ureteropelvic junction: Secondary | ICD-10-CM

## 2020-05-12 DIAGNOSIS — M25511 Pain in right shoulder: Secondary | ICD-10-CM | POA: Diagnosis not present

## 2020-05-12 DIAGNOSIS — M25512 Pain in left shoulder: Secondary | ICD-10-CM | POA: Insufficient documentation

## 2020-05-12 DIAGNOSIS — M25552 Pain in left hip: Secondary | ICD-10-CM | POA: Insufficient documentation

## 2020-05-12 DIAGNOSIS — G9608 Other cranial cerebrospinal fluid leak: Secondary | ICD-10-CM

## 2020-05-12 DIAGNOSIS — M25562 Pain in left knee: Secondary | ICD-10-CM | POA: Diagnosis not present

## 2020-05-12 DIAGNOSIS — N939 Abnormal uterine and vaginal bleeding, unspecified: Secondary | ICD-10-CM | POA: Diagnosis not present

## 2020-05-12 DIAGNOSIS — R600 Localized edema: Secondary | ICD-10-CM | POA: Insufficient documentation

## 2020-05-12 DIAGNOSIS — N130 Hydronephrosis with ureteropelvic junction obstruction: Secondary | ICD-10-CM | POA: Insufficient documentation

## 2020-05-12 DIAGNOSIS — I10 Essential (primary) hypertension: Secondary | ICD-10-CM | POA: Diagnosis not present

## 2020-05-12 DIAGNOSIS — R102 Pelvic and perineal pain: Secondary | ICD-10-CM | POA: Diagnosis present

## 2020-05-12 DIAGNOSIS — N814 Uterovaginal prolapse, unspecified: Secondary | ICD-10-CM | POA: Diagnosis not present

## 2020-05-12 DIAGNOSIS — Z96651 Presence of right artificial knee joint: Secondary | ICD-10-CM | POA: Diagnosis not present

## 2020-05-12 DIAGNOSIS — Z87891 Personal history of nicotine dependence: Secondary | ICD-10-CM | POA: Insufficient documentation

## 2020-05-12 LAB — CBC WITH DIFFERENTIAL/PLATELET
Abs Immature Granulocytes: 0.01 10*3/uL (ref 0.00–0.07)
Basophils Absolute: 0 10*3/uL (ref 0.0–0.1)
Basophils Relative: 0 %
Eosinophils Absolute: 0 10*3/uL (ref 0.0–0.5)
Eosinophils Relative: 0 %
HCT: 30.7 % — ABNORMAL LOW (ref 36.0–46.0)
Hemoglobin: 9.3 g/dL — ABNORMAL LOW (ref 12.0–15.0)
Immature Granulocytes: 0 %
Lymphocytes Relative: 28 %
Lymphs Abs: 1.1 10*3/uL (ref 0.7–4.0)
MCH: 28.8 pg (ref 26.0–34.0)
MCHC: 30.3 g/dL (ref 30.0–36.0)
MCV: 95 fL (ref 80.0–100.0)
Monocytes Absolute: 0.4 10*3/uL (ref 0.1–1.0)
Monocytes Relative: 9 %
Neutro Abs: 2.4 10*3/uL (ref 1.7–7.7)
Neutrophils Relative %: 63 %
Platelets: 215 10*3/uL (ref 150–400)
RBC: 3.23 MIL/uL — ABNORMAL LOW (ref 3.87–5.11)
RDW: 15.5 % (ref 11.5–15.5)
WBC: 3.9 10*3/uL — ABNORMAL LOW (ref 4.0–10.5)
nRBC: 0 % (ref 0.0–0.2)

## 2020-05-12 LAB — COMPREHENSIVE METABOLIC PANEL
ALT: 11 U/L (ref 0–44)
AST: 15 U/L (ref 15–41)
Albumin: 2.8 g/dL — ABNORMAL LOW (ref 3.5–5.0)
Alkaline Phosphatase: 87 U/L (ref 38–126)
Anion gap: 9 (ref 5–15)
BUN: 21 mg/dL (ref 8–23)
CO2: 24 mmol/L (ref 22–32)
Calcium: 8.1 mg/dL — ABNORMAL LOW (ref 8.9–10.3)
Chloride: 109 mmol/L (ref 98–111)
Creatinine, Ser: 1.12 mg/dL — ABNORMAL HIGH (ref 0.44–1.00)
GFR, Estimated: 49 mL/min — ABNORMAL LOW (ref >60)
Glucose, Bld: 86 mg/dL (ref 70–99)
Potassium: 2.9 mmol/L — ABNORMAL LOW (ref 3.5–5.1)
Sodium: 142 mmol/L (ref 135–145)
Total Bilirubin: 0.7 mg/dL (ref 0.3–1.2)
Total Protein: 6.3 g/dL — ABNORMAL LOW (ref 6.5–8.1)

## 2020-05-12 LAB — URINALYSIS, COMPLETE (UACMP) WITH MICROSCOPIC
Bacteria, UA: NONE SEEN
Bilirubin Urine: NEGATIVE
Glucose, UA: NEGATIVE mg/dL
Ketones, ur: NEGATIVE mg/dL
Leukocytes,Ua: NEGATIVE
Nitrite: NEGATIVE
Protein, ur: NEGATIVE mg/dL
Specific Gravity, Urine: 1.025 (ref 1.005–1.030)
pH: 5 (ref 5.0–8.0)

## 2020-05-12 LAB — PROTIME-INR
INR: 1 (ref 0.8–1.2)
Prothrombin Time: 13.2 seconds (ref 11.4–15.2)

## 2020-05-12 LAB — TROPONIN I (HIGH SENSITIVITY): Troponin I (High Sensitivity): 7 ng/L (ref <18)

## 2020-05-12 MED ORDER — LACTATED RINGERS IV BOLUS
500.0000 mL | Freq: Once | INTRAVENOUS | Status: AC
  Administered 2020-05-12: 500 mL via INTRAVENOUS

## 2020-05-12 MED ORDER — FENTANYL CITRATE (PF) 100 MCG/2ML IJ SOLN
50.0000 ug | Freq: Once | INTRAMUSCULAR | Status: AC
  Administered 2020-05-12: 50 ug via INTRAVENOUS
  Filled 2020-05-12: qty 2

## 2020-05-12 MED ORDER — POTASSIUM CHLORIDE CRYS ER 20 MEQ PO TBCR
40.0000 meq | EXTENDED_RELEASE_TABLET | Freq: Once | ORAL | Status: AC
  Administered 2020-05-12: 40 meq via ORAL
  Filled 2020-05-12: qty 2

## 2020-05-12 MED ORDER — IOHEXOL 300 MG/ML  SOLN
75.0000 mL | Freq: Once | INTRAMUSCULAR | Status: AC | PRN
  Administered 2020-05-12: 75 mL via INTRAVENOUS

## 2020-05-12 NOTE — ED Notes (Signed)
Lab at bedside

## 2020-05-12 NOTE — Consult Note (Signed)
Consult History and Physical   SERVICE: Gynecology   Patient Name: Laura Michael Patient MRN:   [id number]  CC: pain in shoulders, hips, left knee, and vaginal bleeding   HPI: Laura Michael is a 86 y.o. [id number]. with complaints of new onset shoulder, hip, and left knee pain.  Reports that she had some vaginal bleeding but she's not sure how long the symptoms have been present for.  She states that she slid out of bed "the other day" but is not sure when it happened.  She reports pelvic pain and pressure but is unsure for how long.  Myrel was evaluated by Dr. Tamala Julian, [facility] ED, and found to have a significant uterine prolapse with the uterus protruding from the vagina.  Dr. Tamala Julian was able to push the uterus back up so that it was no longer protruding through.  Requested for evaluation to determine management options.    Review of Systems: positives in bold Review of Systems  Constitutional: Negative for chills and fever.  Respiratory: Negative for cough and shortness of breath.   Cardiovascular: Positive for leg swelling (in lower extremities ). Negative for chest pain and palpitations.  Gastrointestinal: Negative for abdominal pain, heartburn and nausea.  Genitourinary:       + pelvic pressure and vaginal bleeding   Musculoskeletal: Positive for joint pain (shoulder and left knee pain).  Neurological: Negative for dizziness.  Endo/Heme/Allergies: Does not bruise/bleed easily.  Psychiatric/Behavioral: The patient is not nervous/anxious.      Past Obstetrical History: OB History    Gravida  6   Para  6   Term  6   Preterm      AB      Living        SAB      IAB      Ectopic      Multiple      Live Births              Past Gynecologic History: Patient is postmenopausal.  Reports that she has noticed some vaginal bleeding but is unsure for how long.  Past Medical History: Past Medical History:  Diagnosis Date  . HTN (hypertension)     Past  Surgical History:   Past Surgical History:  Procedure Laterality Date  . INTRAMEDULLARY (IM) NAIL INTERTROCHANTERIC Left 03/23/2020   Procedure: INTRAMEDULLARY (IM) NAIL INTERTROCHANTRIC;  Surgeon: Leim Fabry, MD;  Location: [facility] ORS;  Service: Orthopedics;  Laterality: Left;  . REPLACEMENT TOTAL KNEE Right     Family History:  family history includes Alzheimer's disease in her father; Hypertension in her sister.  Social History:  reports that she has quit smoking. She has never used smokeless tobacco. She reports that she does not drink alcohol and does not use drugs.   Home Medications:  Medications reconciled in EPIC  No current facility-administered medications on file prior to encounter.   Current Outpatient Medications on File Prior to Encounter  Medication Sig Dispense Refill  . acetaminophen (TYLENOL) 325 MG tablet Take 2 tablets (650 mg total) by mouth every 6 (six) hours as needed for mild pain, fever or headache.    . enoxaparin (LOVENOX) 40 MG/0.4ML injection Inject 0.4 mLs (40 mg total) into the skin daily for 14 days. 5.6 mL 0  . lisinopril-hydrochlorothiazide (ZESTORETIC) 20-25 MG tablet Take 1 tablet by mouth daily.    . Multiple Vitamin (MULTIVITAMIN WITH MINERALS) TABS tablet Take 1 tablet by mouth daily.    Marland Kitchen oxyCODONE (OXY IR/ROXICODONE)  5 MG immediate release tablet Take 0.5-1 tablets (2.5-5 mg total) by mouth every 6 (six) hours as needed for moderate pain or severe pain. 30 tablet 0    Allergies:  No Known Allergies  Physical Exam:  Temp:  [97.5 F (36.4 C)] 97.5 F (36.4 C) (04/16 1057) Pulse Rate:  [49-61] 53 (04/16 1430) Resp:  [16-20] 16 (04/16 1430) BP: (121-139)/(69-90) 121/90 (04/16 1430) SpO2:  [98 %-100 %] 100 % (04/16 1430) Weight:  [48.4 kg] 48.4 kg (04/16 1054)  Physical Exam Chaperone present: normal for stated age, + atrophy.  Constitutional:      Appearance: Normal appearance.  HENT:     Mouth/Throat:     Mouth: Mucous membranes  are moist.  Cardiovascular:     Pulses: Normal pulses.  Pulmonary:     Effort: Pulmonary effort is normal.  Abdominal:     General: Abdomen is flat.     Palpations: Abdomen is soft.  Genitourinary:    General: Normal vulva.     Uterus: With uterine prolapse (Uterine prolapse noted during exam, seen easily with valsava manuever, does not protrude past the introitus at this time ).   Musculoskeletal:     Cervical back: Normal range of motion.  Skin:    General: Skin is warm and dry.     Capillary Refill: Capillary refill takes less than 2 seconds.  Neurological:     Mental Status: She is alert and oriented to person, place, and time.  Psychiatric:        Mood and Affect: Mood normal.     Labs/Studies:   CBC and Coags:  Lab Results  Component Value Date   WBC 3.9 (L) 05/12/2020   NEUTOPHILPCT 63 05/12/2020   EOSPCT 0 05/12/2020   BASOPCT 0 05/12/2020   LYMPHOPCT 28 05/12/2020   HGB 9.3 (L) 05/12/2020   HCT 30.7 (L) 05/12/2020   MCV 95.0 05/12/2020   PLT 215 05/12/2020   INR 1.0 05/12/2020   CMP:  Lab Results  Component Value Date   NA 142 05/12/2020   K 2.9 (L) 05/12/2020   CL 109 05/12/2020   CO2 24 05/12/2020   BUN 21 05/12/2020   CREATININE 1.12 (H) 05/12/2020   CREATININE 0.58 03/28/2020   CREATININE 0.60 03/27/2020   PROT 6.3 (L) 05/12/2020   BILITOT 0.7 05/12/2020   ALT 11 05/12/2020   AST 15 05/12/2020   ALKPHOS 87 05/12/2020   Other Imaging: DG Chest 2 View  Result Date: 05/12/2020 CLINICAL DATA:  Fall, lower extremity swelling and hematuria. EXAM: CHEST - 2 VIEW COMPARISON:  Chest x-ray dated 03/23/2020. FINDINGS: Heart size and mediastinal contours are stable. Mild scarring/atelectasis at the lung bases. Lungs otherwise clear. No pleural effusion or pneumothorax is seen. No acute appearing osseous abnormality. Advanced degenerative change at the bilateral shoulders. IMPRESSION: No acute findings. Mild scarring/atelectasis at the lung bases. No  evidence of pneumonia or pulmonary edema. Electronically Signed   By: Franki Cabot M.D.   On: 05/12/2020 12:44   CT Head Wo Contrast  Result Date: 05/12/2020 CLINICAL DATA:  86 year old with recent fall. EXAM: CT HEAD WITHOUT CONTRAST CT CERVICAL SPINE WITHOUT CONTRAST TECHNIQUE: Multidetector CT imaging of the head and cervical spine was performed following the standard protocol without intravenous contrast. Multiplanar CT image reconstructions of the cervical spine were also generated. COMPARISON:  Head CT 03/23/2020 FINDINGS: CT HEAD FINDINGS Brain: New bilateral low-density extra-axial fluid collections. These are most compatible with subdural collections around the frontal and  parietal lobes. These collections are relatively symmetric without evidence of hemorrhage. Collection measures 9 mm in thickness along the left frontal lobe on sequence 3 image 14. Ventricles have slightly decreased in size, most noticeable in the third ventricle and right temporal horn. No evidence for mass lesion, hemorrhage or large infarct. Vascular: No hyperdense vessel or unexpected calcification. Skull: Normal. Negative for fracture or focal lesion. Sinuses/Orbits: Visualized paranasal sinuses are clear. Other: None CT CERVICAL SPINE FINDINGS Alignment: Normal alignment. Skull base and vertebrae: No acute fracture. No primary bone lesion or focal pathologic process. Soft tissues and spinal canal: No prevertebral fluid or swelling. No visible canal hematoma. Disc levels:  Disc space narrowing at C3 through C7. Upper chest: Small nodular densities at the right lung apex. 3 mm nodular density in the right upper lobe on sequence 2 image 69. There is a poorly defined pleural-based density in medial right upper lobe that measures roughly 7 mm and could represent scarring but indeterminate. Negative for pneumothorax. Other: Mild mucosal disease in the inferior right maxillary sinus. Enlarged thyroid. IMPRESSION: 1. New bilateral  low-density subdural collections. Findings are most compatible with bilateral hygromas. No evidence for acute hemorrhage. Ventricles have slightly decreased in size compared to the exam on 03/23/2020. No evidence for a large infarct. 2. No acute abnormality in the cervical spine. Multilevel degenerative changes. 3. Small indeterminate nodular densities at the right lung apex, largest measuring 7 mm. Non-contrast chest CT at 3-6 months is recommended. If the nodules are stable at time of repeat CT, then future CT at 18-24 months (from today's scan) is considered optional for low-risk patients, but is recommended for high-risk patients. This recommendation follows the consensus statement: Guidelines for Management of Incidental Pulmonary Nodules Detected on CT Images: From the Fleischner Society 2017; Radiology 2017; 284:228-243. These results were called by telephone at the time of interpretation on 05/12/2020 at 12:19 pm to provider [facility] , who verbally acknowledged these results. Electronically Signed   By: Markus Daft M.D.   On: 05/12/2020 12:26   CT Cervical Spine Wo Contrast  Result Date: 05/12/2020 CLINICAL DATA:  86 year old with recent fall. EXAM: CT HEAD WITHOUT CONTRAST CT CERVICAL SPINE WITHOUT CONTRAST TECHNIQUE: Multidetector CT imaging of the head and cervical spine was performed following the standard protocol without intravenous contrast. Multiplanar CT image reconstructions of the cervical spine were also generated. COMPARISON:  Head CT 03/23/2020 FINDINGS: CT HEAD FINDINGS Brain: New bilateral low-density extra-axial fluid collections. These are most compatible with subdural collections around the frontal and parietal lobes. These collections are relatively symmetric without evidence of hemorrhage. Collection measures 9 mm in thickness along the left frontal lobe on sequence 3 image 14. Ventricles have slightly decreased in size, most noticeable in the third ventricle and right temporal  horn. No evidence for mass lesion, hemorrhage or large infarct. Vascular: No hyperdense vessel or unexpected calcification. Skull: Normal. Negative for fracture or focal lesion. Sinuses/Orbits: Visualized paranasal sinuses are clear. Other: None CT CERVICAL SPINE FINDINGS Alignment: Normal alignment. Skull base and vertebrae: No acute fracture. No primary bone lesion or focal pathologic process. Soft tissues and spinal canal: No prevertebral fluid or swelling. No visible canal hematoma. Disc levels:  Disc space narrowing at C3 through C7. Upper chest: Small nodular densities at the right lung apex. 3 mm nodular density in the right upper lobe on sequence 2 image 69. There is a poorly defined pleural-based density in medial right upper lobe that measures roughly 7 mm and could  represent scarring but indeterminate. Negative for pneumothorax. Other: Mild mucosal disease in the inferior right maxillary sinus. Enlarged thyroid. IMPRESSION: 1. New bilateral low-density subdural collections. Findings are most compatible with bilateral hygromas. No evidence for acute hemorrhage. Ventricles have slightly decreased in size compared to the exam on 03/23/2020. No evidence for a large infarct. 2. No acute abnormality in the cervical spine. Multilevel degenerative changes. 3. Small indeterminate nodular densities at the right lung apex, largest measuring 7 mm. Non-contrast chest CT at 3-6 months is recommended. If the nodules are stable at time of repeat CT, then future CT at 18-24 months (from today's scan) is considered optional for low-risk patients, but is recommended for high-risk patients. This recommendation follows the consensus statement: Guidelines for Management of Incidental Pulmonary Nodules Detected on CT Images: From the Fleischner Society 2017; Radiology 2017; 284:228-243. These results were called by telephone at the time of interpretation on 05/12/2020 at 12:19 pm to provider [facility] , who verbally  acknowledged these results. Electronically Signed   By: Markus Daft M.D.   On: 05/12/2020 12:26   CT ABDOMEN PELVIS W CONTRAST  Result Date: 05/12/2020 CLINICAL DATA:  Abdominal pain, acute and nonlocalized. Recent fall. History of prolapsed uterus. Hematuria. EXAM: CT ABDOMEN AND PELVIS WITH CONTRAST TECHNIQUE: Multidetector CT imaging of the abdomen and pelvis was performed using the standard protocol following bolus administration of intravenous contrast. CONTRAST:  11mL OMNIPAQUE IOHEXOL 300 MG/ML  SOLN COMPARISON:  None. FINDINGS: Lower chest: Motion degraded images. Bilateral Bochdalek's hernias. On the RIGHT, hernia contains only fat. On the LEFT, hernia contains a portion of the LEFT kidney in the splenic flexure. Small LEFT anterior hernia contains fat anterior to the spleen. Heart size is normal. There is coronary artery calcification. Hepatobiliary: Nonspecific 5 millimeter low-attenuation nodule is identified in the dome of the RIGHT hepatic lobe, not further characterized. No suspicious liver lesions. Gallbladder is distended and contains punctate calcified stones. No pericholecystic fluid. Pancreas: Unremarkable. No pancreatic ductal dilatation or surrounding inflammatory changes. Spleen: Normal in size without focal abnormality. Adrenals/Urinary Tract: Adrenal glands are normal. There is bilateral hydronephrosis and hydroureter to level of the urinary bladder. No obstructing calculi are identified. The findings are likely related to pelvic floor laxity. Urinary bladder wall is mildly thickened. The bladder neck is below the expected location of the pubococcygeal ligament. Stomach/Bowel: Stomach and small bowel loops are normal in appearance. There is moderate stool burden. Rectus below the pubococcygeal ligament consistent with prolapse. There is no evidence for acute appendicitis. Vascular/Lymphatic: There is minimal atherosclerotic calcification of the tortuous abdominal aorta. No associated  aneurysm. No retroperitoneal or mesenteric adenopathy. Reproductive: Uterus is in normal position. The vaginal vault projects below the pubococcygeal ligament. The regions of the ovaries are unremarkable. Other: There is a small amount of ascites in the RIGHT LOWER QUADRANT. Anterior abdominal wall notable for a small fat containing paraumbilical hernia. Musculoskeletal: Status post LEFT hip ORIF. There is significant degenerative change of the LEFT hip, with associated protrusio of the acetabulum. Degenerative changes are seen in the SI joints bilaterally, RIGHT greater than LEFT. Degenerative changes are seen in the spine. There is a remote wedge compression fracture of L1, associated 50% loss of vertebral body height. There is 4 millimeters anterolisthesis of L4 on L5, associated with disc protrusion. IMPRESSION: 1. Bilateral hydronephrosis and hydroureter to level of the urinary bladder. No obstructing calculi are identified. The findings are likely related to pelvic floor laxity and bladder prolapse. Thickened bladder wall.  2. There is prolapse of the rectum and vaginal vault. 3. Cholelithiasis. 4. Small amount of ascites in the RIGHT LOWER QUADRANT. 5. Bilateral Bochdalek's hernias. 6. Coronary artery disease. 7. Remote wedge compression fracture of L1. 8. Postoperative and significant degenerative change of the LEFT hip, with associated protrusio of the acetabulum. 9. Small fat containing paraumbilical hernia. 10. Aortic Atherosclerosis (ICD10-I70.0). Electronically Signed   By: Nolon Nations M.D.   On: 05/12/2020 12:23   US Venous Img Lower Unilateral Left  Result Date: 05/12/2020 CLINICAL DATA:  LOWER extremity edema. EXAM: LEFT LOWER EXTREMITY VENOUS DOPPLER ULTRASOUND TECHNIQUE: Gray-scale sonography with compression, as well as color and duplex ultrasound, were performed to evaluate the deep venous system(s) from the level of the common femoral vein through the popliteal and proximal calf veins.  COMPARISON:  None. FINDINGS: VENOUS Normal compressibility of the common femoral, superficial femoral, and popliteal veins, as well as the visualized calf veins. Visualized portions of profunda femoral vein and great saphenous vein unremarkable. No filling defects to suggest DVT on grayscale or color Doppler imaging. Doppler waveforms show normal direction of venous flow, normal respiratory plasticity and response to augmentation. Limited views of the contralateral common femoral vein are unremarkable. OTHER In the popliteal fossa region, a small fluid collection is 4.1 x 0.8 x 2.3 centimeters. Edema is identified in the region of popliteal fossa and UPPER tibia. Limitations: none IMPRESSION: No evidence for occlusive deep vein thrombosis. LOWER extremity edema. Small popliteal fossa cyst. Electronically Signed   By: Nolon Nations M.D.   On: 05/12/2020 13:10   DG Knee Complete 4 Views Left  Result Date: 05/12/2020 CLINICAL DATA:  Fall, lower extremity swelling. Recent LEFT hip surgery. EXAM: LEFT KNEE - COMPLETE 4+ VIEW COMPARISON:  Plain film of the LEFT knee dated 03/23/2020. FINDINGS: Interval fixation of the LEFT hip and LEFT femur with intramedullary rod and screws. Visualized portion of the hardware appears intact and appropriately positioned. Osseous structures about the LEFT knee are intact and normally aligned. No acute or suspicious osseous finding. No significant degenerative change at the LEFT knee. No appreciable joint effusion. Surrounding soft tissues are unremarkable. IMPRESSION: No acute findings. Fixation hardware within the distal LEFT femur appears intact and appropriately positioned. Electronically Signed   By: Franki Cabot M.D.   On: 05/12/2020 12:47     Assessment / Plan:   Laura Michael is a 86 y.o. [id number]. who presents with uterine prolapse.  1. Uterine prolapse is stable.  It does not protrude past the introitus even with valsalva maneuver.  Discussed this is something  that can happen and reviewed interventions if her uterus prolapse significantly again.  While this is not uncommon, there are different options to help her manage it so that she does not experience the negative side effects of a uterine prolapse.     2. Discussed management options for uterine prolapse to include pessary versus surgical management options.   3. Will have Mrs. Juliana follow up outpatient with Dr. Leafy Ro for pessary fitting to help with symptoms while she considers her options.   4. Her son was at the bedside for discussion of options.  Questions answered to patient and family's satisfaction.     Thank you for the opportunity to be involved with this patient's care.  ----- Drinda Butts, CNM Midwife [facility], Department of [facility]

## 2020-05-12 NOTE — ED Provider Notes (Signed)
[facility] Emergency Department Provider Note  ____________________________________________   Event Date/Time   First MD Initiated Contact with Patient 05/12/20 1053     (approximate)  I have reviewed the triage vital signs and the nursing notes.   HISTORY  Chief Complaint Leg Swelling   HPI Laura Michael is a 86 y.o. female with a past medical history of closed left hip fracture status post repair 2/25, HTN, thrombocytopenia and some anemia associated with recent surgery who presents from home for assessment of multiple complaints including pain in her shoulders, hips, left knee and some vaginal bleeding.  She is not sure how long the symptoms have been present.  She states she slid out of bed "the other day" but is not sure when this happened.  She does not think she hit her head but is not sure.  She is not sure if she had LOC.  She denies any chest pain, cough, shortness of breath, vomiting, diarrhea but does note some lower pelvic pain.  She is not sure how long she has had this lower pelvic pain.  She denies being on any blood thinners.         Past Medical History:  Diagnosis Date  . HTN (hypertension)     Patient Active Problem List   Diagnosis Date Noted  . Malnutrition of moderate degree 03/29/2020  . Acute blood loss anemia 03/28/2020  . Closed left hip fracture ([place]) 03/23/2020  . Fall 03/23/2020  . Normocytic anemia 03/23/2020  . HTN (hypertension)     Past Surgical History:  Procedure Laterality Date  . INTRAMEDULLARY (IM) NAIL INTERTROCHANTERIC Left 03/23/2020   Procedure: INTRAMEDULLARY (IM) NAIL INTERTROCHANTRIC;  Surgeon: Leim Fabry, MD;  Location: [facility] ORS;  Service: Orthopedics;  Laterality: Left;  . REPLACEMENT TOTAL KNEE Right     Prior to Admission medications   Medication Sig Start Date End Date Taking? Authorizing Provider  acetaminophen (TYLENOL) 325 MG tablet Take 2 tablets (650 mg total) by mouth every 6  (six) hours as needed for mild pain, fever or headache. 03/29/20   Samuella Cota, MD  enoxaparin (LOVENOX) 40 MG/0.4ML injection Inject 0.4 mLs (40 mg total) into the skin daily for 14 days. 03/26/20 04/09/20  Reche Dixon, PA-C  lisinopril-hydrochlorothiazide (ZESTORETIC) 20-25 MG tablet Take 1 tablet by mouth daily. 01/24/20   [provider]  Multiple Vitamin (MULTIVITAMIN WITH MINERALS) TABS tablet Take 1 tablet by mouth daily. 03/30/20   Samuella Cota, MD  oxyCODONE (OXY IR/ROXICODONE) 5 MG immediate release tablet Take 0.5-1 tablets (2.5-5 mg total) by mouth every 6 (six) hours as needed for moderate pain or severe pain. 03/26/20   Reche Dixon, PA-C    Allergies Patient has no known allergies.  Family History  Problem Relation Age of Onset  . Alzheimer's disease Father   . Hypertension Sister     Social History Social History   Tobacco Use  . Smoking status: Former Research scientist (life sciences)  . Smokeless tobacco: Never Used  Substance Use Topics  . Alcohol use: Never  . Drug use: Never    Review of Systems  Review of Systems  Constitutional: Negative for chills and fever.  HENT: Negative for sore throat.   Eyes: Negative for pain.  Respiratory: Negative for cough and stridor.   Cardiovascular: Negative for chest pain.  Gastrointestinal: Positive for abdominal pain ( lower abdomen ). Negative for vomiting.  Genitourinary: Negative for dysuria.  Musculoskeletal: Positive for falls, joint pain (L knee, b/l  hips, b/l shoulders) and myalgias ( shoulders, hips, L knee).  Skin: Negative for rash.  Neurological: Negative for seizures, loss of consciousness and headaches.  Psychiatric/Behavioral: Negative for suicidal ideas.  All other systems reviewed and are negative.     ____________________________________________   PHYSICAL EXAM:  VITAL SIGNS: ED Triage Vitals  Enc Vitals Group     BP      Pulse      Resp      Temp      Temp src      SpO2      Weight      Height       Head Circumference      Peak Flow      Pain Score      Pain Loc      Pain Edu?      Excl. in [place]?    Vitals:   05/12/20 1400 05/12/20 1430  BP: 129/69 121/90  Pulse: (!) 49 (!) 53  Resp: 18 16  Temp:    SpO2: 100% 100%   Physical Exam Vitals and nursing note reviewed. Exam conducted with a chaperone present.  Constitutional:      General: She is not in acute distress.    Appearance: She is well-developed.  HENT:     Head: Normocephalic and atraumatic.  Eyes:     Conjunctiva/sclera: Conjunctivae normal.  Cardiovascular:     Rate and Rhythm: Normal rate and regular rhythm.     Heart sounds: No murmur heard.   Pulmonary:     Effort: Pulmonary effort is normal. No respiratory distress.     Breath sounds: Normal breath sounds.  Abdominal:     Palpations: Abdomen is soft.     Tenderness: There is no abdominal tenderness.  Genitourinary:    Uterus: With uterine prolapse.   Musculoskeletal:     Cervical back: Neck supple.     Left lower leg: Edema present.  Skin:    General: Skin is warm and dry.  Neurological:     Mental Status: She is alert.     Ecchymosis noted over the chest.  PERRLA.  EOMI.  No finger dysmetria.  Patient has symmetric strength in upper and lower extremities.  Sensation is intact to light touch in all extremities ____________________________________________   LABS (all labs ordered are listed, but only abnormal results are displayed)  Labs Reviewed  CBC WITH DIFFERENTIAL/PLATELET - Abnormal; Notable for the following components:      Result Value   WBC 3.9 (*)    RBC 3.23 (*)    Hemoglobin 9.3 (*)    HCT 30.7 (*)    All other components within normal limits  COMPREHENSIVE METABOLIC PANEL - Abnormal; Notable for the following components:   Potassium 2.9 (*)    Creatinine, Ser 1.12 (*)    Calcium 8.1 (*)    Total Protein 6.3 (*)    Albumin 2.8 (*)    GFR, Estimated 49 (*)    All other components within normal limits  URINALYSIS,  COMPLETE (UACMP) WITH MICROSCOPIC - Abnormal; Notable for the following components:   Color, Urine YELLOW (*)    APPearance CLEAR (*)    Hgb urine dipstick LARGE (*)    All other components within normal limits  PROTIME-INR  TROPONIN I (HIGH SENSITIVITY)   ____________________________________________  EKG  ____________________________________________  RADIOLOGY  ED MD interpretation: CT head markable for bilateral hygromas.  No evidence of acute intracranial hemorrhage and there is evidence of some  decrease in ventricle size compared to CT on 225.  CT C-spine is unremarkable.  Small lung nodule in the right apex noted which can be followed up not emergently.  Left knee plain film shows no fracture dislocation.  Chest x-ray has no evidence of pneumothorax, rib fracture, full consolidation, overt pulmonary edema or other clear acute thoracic process.  Left lower extremity ultrasound has no evidence of DVT but there is a small popliteal fossa cyst.  CT abdomen pelvis remarkable for bilateral hydronephrosis and hydroureter to the level of the urinary bladder with no stones identified.  There is also some bladder prolapse and thickening of the bladder wall still is evidence of some rectal and vaginal vault prolapse.  Gallstones are noted without evidence of cholecystitis there is bilateral lower abdominal X hernias.  CAD noted.  Remote L1 wedge compression fracture.  No other clear acute abdominopelvic pathology.  Official radiology report(s): DG Chest 2 View  Result Date: 05/12/2020 CLINICAL DATA:  Fall, lower extremity swelling and hematuria. EXAM: CHEST - 2 VIEW COMPARISON:  Chest x-ray dated 03/23/2020. FINDINGS: Heart size and mediastinal contours are stable. Mild scarring/atelectasis at the lung bases. Lungs otherwise clear. No pleural effusion or pneumothorax is seen. No acute appearing osseous abnormality. Advanced degenerative change at the bilateral shoulders. IMPRESSION: No acute findings.  Mild scarring/atelectasis at the lung bases. No evidence of pneumonia or pulmonary edema. Electronically Signed   By: Franki Cabot M.D.   On: 05/12/2020 12:44   CT Head Wo Contrast  Result Date: 05/12/2020 CLINICAL DATA:  86 year old with recent fall. EXAM: CT HEAD WITHOUT CONTRAST CT CERVICAL SPINE WITHOUT CONTRAST TECHNIQUE: Multidetector CT imaging of the head and cervical spine was performed following the standard protocol without intravenous contrast. Multiplanar CT image reconstructions of the cervical spine were also generated. COMPARISON:  Head CT 03/23/2020 FINDINGS: CT HEAD FINDINGS Brain: New bilateral low-density extra-axial fluid collections. These are most compatible with subdural collections around the frontal and parietal lobes. These collections are relatively symmetric without evidence of hemorrhage. Collection measures 9 mm in thickness along the left frontal lobe on sequence 3 image 14. Ventricles have slightly decreased in size, most noticeable in the third ventricle and right temporal horn. No evidence for mass lesion, hemorrhage or large infarct. Vascular: No hyperdense vessel or unexpected calcification. Skull: Normal. Negative for fracture or focal lesion. Sinuses/Orbits: Visualized paranasal sinuses are clear. Other: None CT CERVICAL SPINE FINDINGS Alignment: Normal alignment. Skull base and vertebrae: No acute fracture. No primary bone lesion or focal pathologic process. Soft tissues and spinal canal: No prevertebral fluid or swelling. No visible canal hematoma. Disc levels:  Disc space narrowing at C3 through C7. Upper chest: Small nodular densities at the right lung apex. 3 mm nodular density in the right upper lobe on sequence 2 image 69. There is a poorly defined pleural-based density in medial right upper lobe that measures roughly 7 mm and could represent scarring but indeterminate. Negative for pneumothorax. Other: Mild mucosal disease in the inferior right maxillary sinus.  Enlarged thyroid. IMPRESSION: 1. New bilateral low-density subdural collections. Findings are most compatible with bilateral hygromas. No evidence for acute hemorrhage. Ventricles have slightly decreased in size compared to the exam on 03/23/2020. No evidence for a large infarct. 2. No acute abnormality in the cervical spine. Multilevel degenerative changes. 3. Small indeterminate nodular densities at the right lung apex, largest measuring 7 mm. Non-contrast chest CT at 3-6 months is recommended. If the nodules are stable at time of repeat  CT, then future CT at 18-24 months (from today's scan) is considered optional for low-risk patients, but is recommended for high-risk patients. This recommendation follows the consensus statement: Guidelines for Management of Incidental Pulmonary Nodules Detected on CT Images: From the Fleischner Society 2017; Radiology 2017; 284:228-243. These results were called by telephone at the time of interpretation on 05/12/2020 at 12:19 pm to provider [facility] , who verbally acknowledged these results. Electronically Signed   By: Markus Daft M.D.   On: 05/12/2020 12:26   CT Cervical Spine Wo Contrast  Result Date: 05/12/2020 CLINICAL DATA:  86 year old with recent fall. EXAM: CT HEAD WITHOUT CONTRAST CT CERVICAL SPINE WITHOUT CONTRAST TECHNIQUE: Multidetector CT imaging of the head and cervical spine was performed following the standard protocol without intravenous contrast. Multiplanar CT image reconstructions of the cervical spine were also generated. COMPARISON:  Head CT 03/23/2020 FINDINGS: CT HEAD FINDINGS Brain: New bilateral low-density extra-axial fluid collections. These are most compatible with subdural collections around the frontal and parietal lobes. These collections are relatively symmetric without evidence of hemorrhage. Collection measures 9 mm in thickness along the left frontal lobe on sequence 3 image 14. Ventricles have slightly decreased in size, most  noticeable in the third ventricle and right temporal horn. No evidence for mass lesion, hemorrhage or large infarct. Vascular: No hyperdense vessel or unexpected calcification. Skull: Normal. Negative for fracture or focal lesion. Sinuses/Orbits: Visualized paranasal sinuses are clear. Other: None CT CERVICAL SPINE FINDINGS Alignment: Normal alignment. Skull base and vertebrae: No acute fracture. No primary bone lesion or focal pathologic process. Soft tissues and spinal canal: No prevertebral fluid or swelling. No visible canal hematoma. Disc levels:  Disc space narrowing at C3 through C7. Upper chest: Small nodular densities at the right lung apex. 3 mm nodular density in the right upper lobe on sequence 2 image 69. There is a poorly defined pleural-based density in medial right upper lobe that measures roughly 7 mm and could represent scarring but indeterminate. Negative for pneumothorax. Other: Mild mucosal disease in the inferior right maxillary sinus. Enlarged thyroid. IMPRESSION: 1. New bilateral low-density subdural collections. Findings are most compatible with bilateral hygromas. No evidence for acute hemorrhage. Ventricles have slightly decreased in size compared to the exam on 03/23/2020. No evidence for a large infarct. 2. No acute abnormality in the cervical spine. Multilevel degenerative changes. 3. Small indeterminate nodular densities at the right lung apex, largest measuring 7 mm. Non-contrast chest CT at 3-6 months is recommended. If the nodules are stable at time of repeat CT, then future CT at 18-24 months (from today's scan) is considered optional for low-risk patients, but is recommended for high-risk patients. This recommendation follows the consensus statement: Guidelines for Management of Incidental Pulmonary Nodules Detected on CT Images: From the Fleischner Society 2017; Radiology 2017; 284:228-243. These results were called by telephone at the time of interpretation on 05/12/2020 at 12:19  pm to provider [facility] , who verbally acknowledged these results. Electronically Signed   By: Markus Daft M.D.   On: 05/12/2020 12:26   CT ABDOMEN PELVIS W CONTRAST  Result Date: 05/12/2020 CLINICAL DATA:  Abdominal pain, acute and nonlocalized. Recent fall. History of prolapsed uterus. Hematuria. EXAM: CT ABDOMEN AND PELVIS WITH CONTRAST TECHNIQUE: Multidetector CT imaging of the abdomen and pelvis was performed using the standard protocol following bolus administration of intravenous contrast. CONTRAST:  34mL OMNIPAQUE IOHEXOL 300 MG/ML  SOLN COMPARISON:  None. FINDINGS: Lower chest: Motion degraded images. Bilateral Bochdalek's hernias. On the  RIGHT, hernia contains only fat. On the LEFT, hernia contains a portion of the LEFT kidney in the splenic flexure. Small LEFT anterior hernia contains fat anterior to the spleen. Heart size is normal. There is coronary artery calcification. Hepatobiliary: Nonspecific 5 millimeter low-attenuation nodule is identified in the dome of the RIGHT hepatic lobe, not further characterized. No suspicious liver lesions. Gallbladder is distended and contains punctate calcified stones. No pericholecystic fluid. Pancreas: Unremarkable. No pancreatic ductal dilatation or surrounding inflammatory changes. Spleen: Normal in size without focal abnormality. Adrenals/Urinary Tract: Adrenal glands are normal. There is bilateral hydronephrosis and hydroureter to level of the urinary bladder. No obstructing calculi are identified. The findings are likely related to pelvic floor laxity. Urinary bladder wall is mildly thickened. The bladder neck is below the expected location of the pubococcygeal ligament. Stomach/Bowel: Stomach and small bowel loops are normal in appearance. There is moderate stool burden. Rectus below the pubococcygeal ligament consistent with prolapse. There is no evidence for acute appendicitis. Vascular/Lymphatic: There is minimal atherosclerotic calcification of the  tortuous abdominal aorta. No associated aneurysm. No retroperitoneal or mesenteric adenopathy. Reproductive: Uterus is in normal position. The vaginal vault projects below the pubococcygeal ligament. The regions of the ovaries are unremarkable. Other: There is a small amount of ascites in the RIGHT LOWER QUADRANT. Anterior abdominal wall notable for a small fat containing paraumbilical hernia. Musculoskeletal: Status post LEFT hip ORIF. There is significant degenerative change of the LEFT hip, with associated protrusio of the acetabulum. Degenerative changes are seen in the SI joints bilaterally, RIGHT greater than LEFT. Degenerative changes are seen in the spine. There is a remote wedge compression fracture of L1, associated 50% loss of vertebral body height. There is 4 millimeters anterolisthesis of L4 on L5, associated with disc protrusion. IMPRESSION: 1. Bilateral hydronephrosis and hydroureter to level of the urinary bladder. No obstructing calculi are identified. The findings are likely related to pelvic floor laxity and bladder prolapse. Thickened bladder wall. 2. There is prolapse of the rectum and vaginal vault. 3. Cholelithiasis. 4. Small amount of ascites in the RIGHT LOWER QUADRANT. 5. Bilateral Bochdalek's hernias. 6. Coronary artery disease. 7. Remote wedge compression fracture of L1. 8. Postoperative and significant degenerative change of the LEFT hip, with associated protrusio of the acetabulum. 9. Small fat containing paraumbilical hernia. 10. Aortic Atherosclerosis (ICD10-I70.0). Electronically Signed   By: Nolon Nations M.D.   On: 05/12/2020 12:23   US Venous Img Lower Unilateral Left  Result Date: 05/12/2020 CLINICAL DATA:  LOWER extremity edema. EXAM: LEFT LOWER EXTREMITY VENOUS DOPPLER ULTRASOUND TECHNIQUE: Gray-scale sonography with compression, as well as color and duplex ultrasound, were performed to evaluate the deep venous system(s) from the level of the common femoral vein through  the popliteal and proximal calf veins. COMPARISON:  None. FINDINGS: VENOUS Normal compressibility of the common femoral, superficial femoral, and popliteal veins, as well as the visualized calf veins. Visualized portions of profunda femoral vein and great saphenous vein unremarkable. No filling defects to suggest DVT on grayscale or color Doppler imaging. Doppler waveforms show normal direction of venous flow, normal respiratory plasticity and response to augmentation. Limited views of the contralateral common femoral vein are unremarkable. OTHER In the popliteal fossa region, a small fluid collection is 4.1 x 0.8 x 2.3 centimeters. Edema is identified in the region of popliteal fossa and UPPER tibia. Limitations: none IMPRESSION: No evidence for occlusive deep vein thrombosis. LOWER extremity edema. Small popliteal fossa cyst. Electronically Signed   By: Nolon Nations  M.D.   On: 05/12/2020 13:10   DG Knee Complete 4 Views Left  Result Date: 05/12/2020 CLINICAL DATA:  Fall, lower extremity swelling. Recent LEFT hip surgery. EXAM: LEFT KNEE - COMPLETE 4+ VIEW COMPARISON:  Plain film of the LEFT knee dated 03/23/2020. FINDINGS: Interval fixation of the LEFT hip and LEFT femur with intramedullary rod and screws. Visualized portion of the hardware appears intact and appropriately positioned. Osseous structures about the LEFT knee are intact and normally aligned. No acute or suspicious osseous finding. No significant degenerative change at the LEFT knee. No appreciable joint effusion. Surrounding soft tissues are unremarkable. IMPRESSION: No acute findings. Fixation hardware within the distal LEFT femur appears intact and appropriately positioned. Electronically Signed   By: Franki Cabot M.D.   On: 05/12/2020 12:47    ____________________________________________   PROCEDURES  Procedure(s) performed (including Critical Care):  .1-3 Lead EKG Interpretation Performed by: Lucrezia Starch, MD Authorized  by: Lucrezia Starch, MD     ECG rate assessment: normal     Rhythm: sinus rhythm     Ectopy: none     Conduction: normal       ____________________________________________   INITIAL IMPRESSION / ASSESSMENT AND PLAN / ED COURSE      Patient presents with above to history exam for multiple complaints including bilateral shoulder pain, hip pain, pelvic pain and some vaginal bleeding as well as possible recent fall.   Patient is a somewhat poor historian with concern for possible recent fall and significant vaginal prolapse and inversion noted on arrival CT head, C-spine, chest x-ray, CT abdomen pelvis and plain film left knee obtained.  In addition given there is slightly more edema in the left lower leg compared to the right ultrasound obtained.  CT head markable for bilateral hygromas.  No evidence of acute intracranial hemorrhage and there is evidence of some decrease in ventricle size compared to CT on 225.  CT C-spine is unremarkable.  Small lung nodule in the right apex noted which can be followed up not emergently.  Left knee plain film shows no fracture dislocation.  Chest x-ray has no evidence of pneumothorax, rib fracture, full consolidation, overt pulmonary edema or other clear acute thoracic process.  Left lower extremity ultrasound has no evidence of DVT but there is a small popliteal fossa cyst.  CT abdomen pelvis remarkable for bilateral hydronephrosis and hydroureter to the level of the urinary bladder with no stones identified.  There is also some bladder prolapse and thickening of the bladder wall still is evidence of some rectal and vaginal vault prolapse.  Gallstones are noted without evidence of cholecystitis there is bilateral lower abdominal X hernias.  CAD noted.  Remote L1 wedge compression fracture.  No other clear acute abdominopelvic pathology.  With regard to patient's bilateral hygromas discussed these with on-call neurosurgeon Dr. Izora Ribas.  He stated that is  likely chronic and that there was no indication for acute intervention or other diagnostic studies at this time with regard to these.  With regard to multiple prolapses noted on abdomen pelvis with hydronephrosis after bedside urine prolapse reduction discussed with on-call gynecologist Dr. Leafy Ro as well as urologist Dr. Norm Parcel.  Suspect likely some hydronephrosis due to acute prolapse but no indication for acute surgical intervention at this time given she is now reduced and kidney function is within normal limits and she does not appear infected on her UA.  CBC shows WBC of 3.9 with hemoglobin at baseline.  CMP shows a K  of 2.9 which was repleted with no other significant derangements.  Troponin obtained nonelevated patient denies any chest pain Evalose patient for ACS.  Per Dr. Leafy Ro and Dr. Jeb Levering with regard to her prolapses since she is not reduced and is unable to pass urine without difficulty she is appropriate for outpatient follow-up in their clinics where she will likely undergo evaluation for pessary versus surgical repair and likely undergo renal ultrasound with urology.  Discussed this with patient and her son was at bedside and recommendation for close outpatient gynecology and urology follow-up.  They are amenable to this plan.  Given stable vitals with eyes reassuring exam and work-up I think this is reasonable.  Patient discharged stable condition.  Strict return precautions advised and discussed.        ____________________________________________   FINAL CLINICAL IMPRESSION(S) / ED DIAGNOSES  Final diagnoses:  Uterine prolapse  Subdural hygroma  Hydronephrosis with ureteropelvic junction (UPJ) obstruction    Medications  fentaNYL (SUBLIMAZE) injection 50 mcg (50 mcg Intravenous Given 05/12/20 1125)  iohexol (OMNIPAQUE) 300 MG/ML solution 75 mL (75 mLs Intravenous Contrast Given 05/12/20 1133)  lactated ringers bolus 500 mL (0 mLs Intravenous Stopped 05/12/20 1458)   potassium chloride SA (KLOR-CON) CR tablet 40 mEq (40 mEq Oral Given 05/12/20 1416)     ED Discharge Orders    None       Note:  This document was prepared using Dragon voice recognition software and may include unintentional dictation errors.   Lucrezia Starch, MD 05/12/20 1539

## 2020-05-12 NOTE — ED Triage Notes (Signed)
Pt to ED POV for bilateral leg swelling and hematuria. Swelling noted to BLE  Pt stating multiple complaints. States recent surgery, hip replacement and undergoing pt  Dr Tamala Julian at bedside

## 2020-05-12 NOTE — ED Notes (Signed)
Pt transported to CT and X-Ray at this time.  

## 2020-05-14 ENCOUNTER — Emergency Department
Admission: EM | Admit: 2020-05-14 | Discharge: 2020-05-14 | Disposition: A | Discharge status: Home / Self Care | Payer: Medicare HMO | Attending: Emergency Medicine | Admitting: Emergency Medicine

## 2020-05-14 ENCOUNTER — Other Ambulatory Visit: Payer: Self-pay

## 2020-05-14 DIAGNOSIS — N390 Urinary tract infection, site not specified: Secondary | ICD-10-CM | POA: Diagnosis not present

## 2020-05-14 DIAGNOSIS — Z79899 Other long term (current) drug therapy: Secondary | ICD-10-CM | POA: Diagnosis not present

## 2020-05-14 DIAGNOSIS — Z87891 Personal history of nicotine dependence: Secondary | ICD-10-CM | POA: Diagnosis not present

## 2020-05-14 DIAGNOSIS — I10 Essential (primary) hypertension: Secondary | ICD-10-CM | POA: Insufficient documentation

## 2020-05-14 DIAGNOSIS — N814 Uterovaginal prolapse, unspecified: Secondary | ICD-10-CM | POA: Diagnosis not present

## 2020-05-14 DIAGNOSIS — R319 Hematuria, unspecified: Secondary | ICD-10-CM | POA: Diagnosis present

## 2020-05-14 LAB — CBC WITH DIFFERENTIAL/PLATELET
Abs Immature Granulocytes: 0.02 10*3/uL (ref 0.00–0.07)
Basophils Absolute: 0 10*3/uL (ref 0.0–0.1)
Basophils Relative: 0 %
Eosinophils Absolute: 0 10*3/uL (ref 0.0–0.5)
Eosinophils Relative: 1 %
HCT: 32.9 % — ABNORMAL LOW (ref 36.0–46.0)
Hemoglobin: 10.5 g/dL — ABNORMAL LOW (ref 12.0–15.0)
Immature Granulocytes: 1 %
Lymphocytes Relative: 28 %
Lymphs Abs: 1.2 10*3/uL (ref 0.7–4.0)
MCH: 30 pg (ref 26.0–34.0)
MCHC: 31.9 g/dL (ref 30.0–36.0)
MCV: 94 fL (ref 80.0–100.0)
Monocytes Absolute: 0.4 10*3/uL (ref 0.1–1.0)
Monocytes Relative: 10 %
Neutro Abs: 2.5 10*3/uL (ref 1.7–7.7)
Neutrophils Relative %: 60 %
Platelets: 227 10*3/uL (ref 150–400)
RBC: 3.5 MIL/uL — ABNORMAL LOW (ref 3.87–5.11)
RDW: 15.6 % — ABNORMAL HIGH (ref 11.5–15.5)
WBC: 4.1 10*3/uL (ref 4.0–10.5)
nRBC: 0 % (ref 0.0–0.2)

## 2020-05-14 LAB — BASIC METABOLIC PANEL
Anion gap: 9 (ref 5–15)
BUN: 15 mg/dL (ref 8–23)
CO2: 25 mmol/L (ref 22–32)
Calcium: 8.6 mg/dL — ABNORMAL LOW (ref 8.9–10.3)
Chloride: 107 mmol/L (ref 98–111)
Creatinine, Ser: 1 mg/dL (ref 0.44–1.00)
GFR, Estimated: 56 mL/min — ABNORMAL LOW (ref >60)
Glucose, Bld: 83 mg/dL (ref 70–99)
Potassium: 2.9 mmol/L — ABNORMAL LOW (ref 3.5–5.1)
Sodium: 141 mmol/L (ref 135–145)

## 2020-05-14 LAB — URINALYSIS, COMPLETE (UACMP) WITH MICROSCOPIC
Bilirubin Urine: NEGATIVE
Glucose, UA: NEGATIVE mg/dL
Ketones, ur: NEGATIVE mg/dL
Nitrite: NEGATIVE
Protein, ur: NEGATIVE mg/dL
Specific Gravity, Urine: 1.013 (ref 1.005–1.030)
pH: 5 (ref 5.0–8.0)

## 2020-05-14 MED ORDER — ACETAMINOPHEN 325 MG PO TABS
650.0000 mg | ORAL_TABLET | Freq: Once | ORAL | Status: AC
  Administered 2020-05-14: 650 mg via ORAL
  Filled 2020-05-14: qty 2

## 2020-05-14 MED ORDER — NITROFURANTOIN MONOHYD MACRO 100 MG PO CAPS: 100.0000 mg | ORAL_CAPSULE | Freq: Two times a day (BID) | ORAL | 0 refills | Status: AC

## 2020-05-14 NOTE — ED Provider Notes (Signed)
[facility] Emergency Department Provider Note   ____________________________________________    I have reviewed the triage vital signs and the nursing notes.   HISTORY  Chief Complaint Hematuria   History somewhat limited by patient is a poor historian  HPI Laura Michael is a 86 y.o. female who presents today with complaints of difficulty urinating with hematuria.  Apparently this started earlier this morning although is not entirely clear.  Review of records and straight the patient was here recently had a uterine prolapse which was reduced in the emergency department.  Plan is for outpatient follow-up with [facility] clinic for possible pessary.  No reports of fevers chills nausea or vomiting.  Past Medical History:  Diagnosis Date  . HTN (hypertension)     Patient Active Problem List   Diagnosis Date Noted  . Malnutrition of moderate degree 03/29/2020  . Acute blood loss anemia 03/28/2020  . Closed left hip fracture ([place]) 03/23/2020  . Fall 03/23/2020  . Normocytic anemia 03/23/2020  . HTN (hypertension)     Past Surgical History:  Procedure Laterality Date  . INTRAMEDULLARY (IM) NAIL INTERTROCHANTERIC Left 03/23/2020   Procedure: INTRAMEDULLARY (IM) NAIL INTERTROCHANTRIC;  Surgeon: Leim Fabry, MD;  Location: [facility] ORS;  Service: Orthopedics;  Laterality: Left;  . REPLACEMENT TOTAL KNEE Right     Prior to Admission medications   Medication Sig Start Date End Date Taking? Authorizing Provider  nitrofurantoin, macrocrystal-monohydrate, (MACROBID) 100 MG capsule Take 1 capsule (100 mg total) by mouth 2 (two) times daily for 7 days. 05/14/20 05/21/20 Yes Lavonia Drafts, MD  acetaminophen (TYLENOL) 325 MG tablet Take 2 tablets (650 mg total) by mouth every 6 (six) hours as needed for mild pain, fever or headache. 03/29/20   Samuella Cota, MD  enoxaparin (LOVENOX) 40 MG/0.4ML injection Inject 0.4 mLs (40 mg total) into the skin daily for 14  days. 03/26/20 04/09/20  Reche Dixon, PA-C  lisinopril-hydrochlorothiazide (ZESTORETIC) 20-25 MG tablet Take 1 tablet by mouth daily. 01/24/20   [provider]  Multiple Vitamin (MULTIVITAMIN WITH MINERALS) TABS tablet Take 1 tablet by mouth daily. 03/30/20   Samuella Cota, MD  oxyCODONE (OXY IR/ROXICODONE) 5 MG immediate release tablet Take 0.5-1 tablets (2.5-5 mg total) by mouth every 6 (six) hours as needed for moderate pain or severe pain. 03/26/20   Reche Dixon, PA-C     Allergies Patient has no known allergies.  Family History  Problem Relation Age of Onset  . Alzheimer's disease Father   . Hypertension Sister     Social History Social History   Tobacco Use  . Smoking status: Former Research scientist (life sciences)  . Smokeless tobacco: Never Used  Substance Use Topics  . Alcohol use: Never  . Drug use: Never    Review of Systems  Constitutional: No fever/chills Eyes: No visual changes.  ENT: No sore throat. Cardiovascular: Denies chest pain. Respiratory: Denies shortness of breath. Gastrointestinal: No abdominal pain.    Genitourinary: As above Musculoskeletal: Negative for back pain. Skin: Negative for rash. Neurological: Negative for headaches    ____________________________________________   PHYSICAL EXAM:  VITAL SIGNS: ED Triage Vitals  Enc Vitals Group     BP 05/14/20 0932 122/66     Pulse Rate 05/14/20 0932 (!) 57     Resp 05/14/20 0932 16     Temp 05/14/20 0932 98 F (36.7 C)     Temp Source 05/14/20 0932 Oral     SpO2 05/14/20 0932 100 %  Weight 05/14/20 0934 55.8 kg (123 lb)     Height 05/14/20 0934 1.626 m (5\' 4" )     Head Circumference --      Peak Flow --      Pain Score 05/14/20 0933 0     Pain Loc --      Pain Edu? --      Excl. in [place]? --     Constitutional: Alert . Nose: No congestion/rhinnorhea. Mouth/Throat: Mucous membranes are moist.   Neck:  Painless ROM Cardiovascular: Normal rate, regular rhythm. Grossly normal heart sounds.  Good  peripheral circulation. Respiratory: Normal respiratory effort.  No retractions. Lungs CTAB. Gastrointestinal: Soft and nontender. No distention.   Genitourinary: Significant uterine prolapse noted, no bleeding Musculoskeletal:   Warm and well perfused Neurologic:  Normal speech and language. No gross focal neurologic deficits are appreciated.  Skin:  Skin is warm, dry and intact. No rash noted. Psychiatric: Mood and affect are normal. Speech and behavior are normal.  ____________________________________________   LABS (all labs ordered are listed, but only abnormal results are displayed)  Labs Reviewed  BASIC METABOLIC PANEL - Abnormal; Notable for the following components:      Result Value   Potassium 2.9 (*)    Calcium 8.6 (*)    GFR, Estimated 56 (*)    All other components within normal limits  CBC WITH DIFFERENTIAL/PLATELET - Abnormal; Notable for the following components:   RBC 3.50 (*)    Hemoglobin 10.5 (*)    HCT 32.9 (*)    RDW 15.6 (*)    All other components within normal limits  URINALYSIS, COMPLETE (UACMP) WITH MICROSCOPIC - Abnormal; Notable for the following components:   Color, Urine YELLOW (*)    APPearance HAZY (*)    Hgb urine dipstick LARGE (*)    Leukocytes,Ua LARGE (*)    Bacteria, UA RARE (*)    All other components within normal limits  URINE CULTURE   ____________________________________________  EKG  None ____________________________________________  RADIOLOGY  None ____________________________________________   PROCEDURES  Procedure(s) performed: yes  Uterine prolapse reduction  Date/Time: 05/14/2020 11:46 AM Performed by: Lavonia Drafts, MD Authorized by: Lavonia Drafts, MD  Consent: Verbal consent obtained. Consent given by: patient Local anesthesia used: no  Anesthesia: Local anesthesia used: no  Sedation: Patient sedated: no  Patient tolerance: patient tolerated the procedure well with no immediate  complications      Critical Care performed: No ____________________________________________   INITIAL IMPRESSION / ASSESSMENT AND PLAN / ED COURSE  Pertinent labs & imaging results that were available during my care of the patient were reviewed by me and considered in my medical decision making (see chart for details).  Patient presents with above complaints, on exam found to have significant uterine prolapse, this was reduced by me, patient tolerated well.  Consulted with [facility] gynecology, they did see the patient in the room.  They have arranged for pessary placement within the next 2 days.  Patient and family are comfortable with this plan.  Patient's lab work is reassuring, will cover with antibiotics given urinalysis    ____________________________________________   FINAL CLINICAL IMPRESSION(S) / ED DIAGNOSES  Final diagnoses:  Uterine prolapse  Lower urinary tract infectious disease        Note:  This document was prepared using Dragon voice recognition software and may include unintentional dictation errors.   Lavonia Drafts, MD 05/14/20 1147

## 2020-05-14 NOTE — ED Notes (Signed)
Dr Corky Downs at bedside replacing uterus. Uterus now staying inside. Purewick back in place. Pt asking for water and pain med for superficial bruises on upper chest.

## 2020-05-14 NOTE — ED Triage Notes (Signed)
Pt to ED via [facility] from home Seen 4d ago at this ED, diagnosed with uterine prolapse C/O mild burning with urination and "blood in urine" Also has bilateral weakness in legs (ongoing) T 98.2, CBG 97,BP 130/78 sitting Pt in NAD, on RA

## 2020-05-15 LAB — URINE CULTURE

## 2020-05-21 ENCOUNTER — Other Ambulatory Visit: Payer: Self-pay | Admitting: Urology

## 2020-05-21 DIAGNOSIS — N1339 Other hydronephrosis: Secondary | ICD-10-CM

## 2020-06-01 ENCOUNTER — Telehealth: Payer: Self-pay | Admitting: Urology

## 2020-07-10 ENCOUNTER — Other Ambulatory Visit: Payer: Self-pay

## 2020-07-10 ENCOUNTER — Ambulatory Visit
Admission: RE | Admit: 2020-07-10 | Discharge: 2020-07-10 | Disposition: A | Discharge status: Home / Self Care | Payer: Medicare HMO | Source: Ambulatory Visit | Attending: Urology | Admitting: Urology

## 2020-07-10 DIAGNOSIS — N1339 Other hydronephrosis: Secondary | ICD-10-CM

## 2020-07-16 ENCOUNTER — Telehealth: Payer: Self-pay | Admitting: Urology

## 2020-07-18 ENCOUNTER — Encounter: Payer: Self-pay | Admitting: Urology

## 2020-07-18 ENCOUNTER — Ambulatory Visit (INDEPENDENT_AMBULATORY_CARE_PROVIDER_SITE_OTHER): Payer: Medicare HMO | Admitting: Urology

## 2020-07-18 ENCOUNTER — Other Ambulatory Visit: Payer: Self-pay

## 2020-07-18 VITALS — BP 161/75 | HR 52 | Ht 63.0 in

## 2020-07-18 DIAGNOSIS — N813 Complete uterovaginal prolapse: Secondary | ICD-10-CM

## 2020-07-18 DIAGNOSIS — N3281 Overactive bladder: Secondary | ICD-10-CM

## 2020-07-18 DIAGNOSIS — N1339 Other hydronephrosis: Secondary | ICD-10-CM

## 2020-07-18 NOTE — Progress Notes (Signed)
   07/18/20 10:11 AM   Laura Michael Nov 10, 1934 [id number]  CC: Bilateral hydronephrosis, prolapse, urinary symptoms  HPI: She is a frail 86 year old female who was seen in the ED in April 2022 with complete prolapse and CT at that time showed bilateral hydroureteronephrosis down to the bladder likely secondary to acute prolapse.  This was reduced and she followed up with GYN and was started on a pessary which is significantly improved her prolapse symptoms.  She denies any urinary symptoms today aside from some mild frequency and urgency, but she is not having any significant incontinence at this time.  It does not sound like she is taking Vesicare or any other OAB medications, though it sounds like this may have been started by GYN at some point.  She denies any hematuria or dysuria.  Follow-up renal ultrasound on 07/11/2020 showed resolution of prior bilateral hydronephrosis.  Her son is with her today who contributes to most of the history.    PMH: Past Medical History:  Diagnosis Date   HTN (hypertension)     Surgical History: Past Surgical History:  Procedure Laterality Date   INTRAMEDULLARY (IM) NAIL INTERTROCHANTERIC Left 03/23/2020   Procedure: INTRAMEDULLARY (IM) NAIL INTERTROCHANTRIC;  Surgeon: Leim Fabry, MD;  Location: [facility] ORS;  Service: Orthopedics;  Laterality: Left;   REPLACEMENT TOTAL KNEE Right       Family History: Family History  Problem Relation Age of Onset   Alzheimer's disease Father    Hypertension Sister     Social History:  reports that she has quit smoking. She has never used smokeless tobacco. She reports that she does not drink alcohol and does not use drugs.  Physical Exam: BP (!) 161/75 (BP Location: Left Arm, Patient Position: Sitting, Cuff Size: Normal)   Pulse (!) 52   Ht 5\' 3"  (1.6 m)   BMI 21.79 kg/m    Constitutional: Frail-appearing, in wheelchair, alert Cardiovascular: No clubbing, cyanosis, or edema. Respiratory: Normal  respiratory effort, no increased work of breathing. GI: Abdomen is soft, nontender, nondistended, no abdominal masses  Laboratory Data: Reviewed, renal function normal  Pertinent Imaging: I have personally viewed and interpreted the CT abdomen and pelvis with contrast dated 05/12/2020 as well as the recent renal ultrasound 07/11/2020.  Bilateral hydrooureteronephrosis down to the bladder on CT likely secondary to pelvic organ prolapse, which has since resolved on follow-up ultrasound after pessary use.  Assessment & Plan:   86 year old female who presented to the ED in April 2022 with complete uterine prolapse causing bilateral hydroureteronephrosis.  She has been managed with a pessary by GYN, and has a upcoming follow-up with [facility] for second opinion to consider surgical options.  Renal function is normal, and bilateral hydronephrosis resolved on recent renal ultrasound.  She does not have any significant overactive bladder symptoms aside from mild frequency and urgency, but not having any significant incontinence at this time.  Agree with continued GYN follow-up regarding prolapse Follow-up with urology as needed   Laura Madrid, MD 07/18/2020  [place] [address] [place],  [address] [phone]

## 2020-10-11 ENCOUNTER — Inpatient Hospital Stay: Payer: Medicare HMO

## 2020-10-11 ENCOUNTER — Other Ambulatory Visit: Payer: Self-pay

## 2020-10-11 ENCOUNTER — Encounter: Payer: Self-pay | Admitting: Oncology

## 2020-10-11 ENCOUNTER — Inpatient Hospital Stay: Payer: Medicare HMO | Attending: Oncology | Admitting: Oncology

## 2020-10-11 VITALS — BP 149/66 | HR 80 | Temp 95.8°F | Resp 18

## 2020-10-11 DIAGNOSIS — D649 Anemia, unspecified: Secondary | ICD-10-CM

## 2020-10-11 DIAGNOSIS — R5383 Other fatigue: Secondary | ICD-10-CM | POA: Insufficient documentation

## 2020-10-11 DIAGNOSIS — M25562 Pain in left knee: Secondary | ICD-10-CM | POA: Diagnosis not present

## 2020-10-11 DIAGNOSIS — M25561 Pain in right knee: Secondary | ICD-10-CM | POA: Insufficient documentation

## 2020-10-11 DIAGNOSIS — Z79899 Other long term (current) drug therapy: Secondary | ICD-10-CM | POA: Insufficient documentation

## 2020-10-11 DIAGNOSIS — D708 Other neutropenia: Secondary | ICD-10-CM | POA: Insufficient documentation

## 2020-10-11 DIAGNOSIS — D696 Thrombocytopenia, unspecified: Secondary | ICD-10-CM | POA: Diagnosis not present

## 2020-10-11 DIAGNOSIS — Z993 Dependence on wheelchair: Secondary | ICD-10-CM | POA: Diagnosis not present

## 2020-10-11 NOTE — Progress Notes (Signed)
Hematology/Oncology Consult note   Patient Care Team: Casilda Carls, MD as PCP - General (Internal Medicine)  REFERRING PROVIDER: Casilda Carls, MD  CHIEF COMPLAINTS/REASON FOR VISIT:  Evaluation of neutropenia, thrombocytopenia and anemia.   HISTORY OF PRESENTING ILLNESS:   Laura Michael is a  86 y.o.  female with PMH listed below was seen in consultation at the request of  Casilda Carls, MD  for evaluation of neutropenia, thrombocytopenia and anemia.   Patient had blood work done recently with PCP.  09/10/2020  Wbc 2.4, ANC 0.9, platelet 144,000, hemoglobin 11.4.   Patient was accompanied by her son. She feels at baseline line heath state. Knee arthralgia, right worse than left side.  Denies weight loss, fever, chills, fatigue, night sweats.    Review of Systems  Constitutional:  Positive for fatigue. Negative for appetite change, chills and fever.  HENT:   Negative for hearing loss and voice change.   Eyes:  Negative for eye problems.  Respiratory:  Negative for chest tightness and cough.   Cardiovascular:  Negative for chest pain.  Gastrointestinal:  Negative for abdominal distention, abdominal pain and blood in stool.  Endocrine: Negative for hot flashes.  Genitourinary:  Negative for difficulty urinating and frequency.   Musculoskeletal:  Negative for arthralgias.  Skin:  Negative for itching and rash.  Neurological:  Negative for extremity weakness.  Hematological:  Negative for adenopathy.  Psychiatric/Behavioral:  Negative for confusion.    MEDICAL HISTORY:  Past Medical History:  Diagnosis Date   HTN (hypertension)     SURGICAL HISTORY: Past Surgical History:  Procedure Laterality Date   INTRAMEDULLARY (IM) NAIL INTERTROCHANTERIC Left 03/23/2020   Procedure: INTRAMEDULLARY (IM) NAIL INTERTROCHANTRIC;  Surgeon: Leim Fabry, MD;  Location: [facility] ORS;  Service: Orthopedics;  Laterality: Left;   REPLACEMENT TOTAL KNEE Right     SOCIAL HISTORY: Social  History   Socioeconomic History   Marital status: Widowed    Spouse name: Not on file   Number of children: Not on file   Years of education: Not on file   Highest education level: Not on file  Occupational History   Not on file  Tobacco Use   Smoking status: Former   Smokeless tobacco: Never  Substance and Sexual Activity   Alcohol use: Never   Drug use: Never   Sexual activity: Not Currently    Birth control/protection: Post-menopausal  Other Topics Concern   Not on file  Social History Narrative   Not on file   Social Determinants of Health   Financial Resource Strain: Not on file  Food Insecurity: Not on file  Transportation Needs: Not on file  Physical Activity: Not on file  Stress: Not on file  Social Connections: Not on file  Intimate Partner Violence: Not on file    FAMILY HISTORY: Family History  Problem Relation Age of Onset   Alzheimer's disease Father    Hypertension Sister     ALLERGIES:  has No Known Allergies.  MEDICATIONS:  Current Outpatient Medications  Medication Sig Dispense Refill   acetaminophen (TYLENOL) 325 MG tablet Take 2 tablets (650 mg total) by mouth every 6 (six) hours as needed for mild pain, fever or headache.     lisinopril-hydrochlorothiazide (ZESTORETIC) 20-25 MG tablet Take 1 tablet by mouth daily.     meloxicam (MOBIC) 15 MG tablet Take by mouth.     Multiple Vitamin (MULTIVITAMIN WITH MINERALS) TABS tablet Take 1 tablet by mouth daily.     solifenacin (VESICARE) 5 MG  tablet Take by mouth.     Cholecalciferol 1.25 MG (50000 UT) capsule Take by mouth. (Patient not taking: Reported on 10/11/2020)     estradiol (ESTRACE) 0.1 MG/GM vaginal cream Insert pea size amount vaginally nightly x 2 weeks then every other night x 2 weeks, then 2-3 times weekly for maintenance (Patient not taking: Reported on 10/11/2020)     No current facility-administered medications for this visit.     PHYSICAL EXAMINATION: ECOG PERFORMANCE STATUS: 2 -  Symptomatic, <50% confined to bed Vitals:   10/11/20 1506  BP: (!) 149/66  Pulse: 80  Resp: 18  Temp: (!) 95.8 F (35.4 C)  SpO2: 98%   Filed Weights    Physical Exam Constitutional:      General: She is not in acute distress.    Comments: She sits in a wheelchair. She walks with walker at home.   HENT:     Head: Normocephalic and atraumatic.  Eyes:     General: No scleral icterus. Cardiovascular:     Rate and Rhythm: Normal rate and regular rhythm.     Heart sounds: Normal heart sounds.  Pulmonary:     Effort: Pulmonary effort is normal. No respiratory distress.     Breath sounds: No wheezing.  Abdominal:     General: Bowel sounds are normal. There is no distension.     Palpations: Abdomen is soft.  Musculoskeletal:        General: No deformity. Normal range of motion.     Cervical back: Normal range of motion and neck supple.  Skin:    General: Skin is warm and dry.     Findings: No erythema or rash.  Neurological:     Mental Status: She is alert. Mental status is at baseline.     Cranial Nerves: No cranial nerve deficit.     Coordination: Coordination normal.  Psychiatric:        Mood and Affect: Mood normal.    LABORATORY DATA:  I have reviewed the data as listed Lab Results  Component Value Date   WBC 4.1 05/14/2020   HGB 10.5 (L) 05/14/2020   HCT 32.9 (L) 05/14/2020   MCV 94.0 05/14/2020   PLT 227 05/14/2020   Recent Labs    03/28/20 0612 05/12/20 1254 05/14/20 0948  NA 141 142 141  K 3.5 2.9* 2.9*  CL 111 109 107  CO2 '24 24 25  '$ GLUCOSE 96 86 83  BUN '13 21 15  '$ CREATININE 0.58 1.12* 1.00  CALCIUM 8.1* 8.1* 8.6*  GFRNONAA >60 49* 56*  PROT  --  6.3*  --   ALBUMIN  --  2.8*  --   AST  --  15  --   ALT  --  11  --   ALKPHOS  --  87  --   BILITOT  --  0.7  --    Iron/TIBC/Ferritin/ %Sat No results found for: IRON, TIBC, FERRITIN, IRONPCTSAT    RADIOGRAPHIC STUDIES: I have personally reviewed the radiological images as listed and agreed  with the findings in the report. No results found.    ASSESSMENT & PLAN:  1. Normocytic anemia   2. Other neutropenia ([place])   3. Thrombocytopenia ([facility])    Check cbc, cmp, smear, retic panel, flowcytometry, ldh, immature platelet fraction, folate, B12, iron tibc ferritin. Hepatitis and HIV. Multiple attempt failed to collect blood today. Patient and family prefer to come back in 2 weeks for lab encounter.   Follow up 2 weeks  after blood work is done to review results.   Orders Placed This Encounter  Procedures   Technologist smear review    Standing Status:   Future    Standing Expiration Date:   10/11/2021   CBC with Differential/Platelet    Standing Status:   Future    Number of Occurrences:   1    Standing Expiration Date:   10/11/2021   Comprehensive metabolic panel    Standing Status:   Future    Standing Expiration Date:   10/11/2021   CBC with Differential/Platelet    Standing Status:   Future    Standing Expiration Date:   10/11/2021   Retic Panel    Standing Status:   Future    Standing Expiration Date:   10/11/2021   Flow cytometry panel-leukemia/lymphoma work-up    Standing Status:   Future    Standing Expiration Date:   10/11/2021   Protein electrophoresis, serum    Standing Status:   Future    Standing Expiration Date:   10/11/2021   Lactate dehydrogenase    Standing Status:   Future    Standing Expiration Date:   10/11/2021   Immature Platelet Fraction    Standing Status:   Future    Standing Expiration Date:   10/11/2021   Folate    Standing Status:   Future    Standing Expiration Date:   10/11/2021   Vitamin B12    Standing Status:   Future    Standing Expiration Date:   10/11/2021   Ferritin    Standing Status:   Future    Standing Expiration Date:   10/11/2021   Iron and TIBC    Standing Status:   Future    Standing Expiration Date:   10/11/2021   HIV Antibody (routine testing w rflx)    Standing Status:   Future    Standing Expiration Date:   10/11/2021    Hepatitis panel, acute    Standing Status:   Future    Standing Expiration Date:   10/11/2021   Technologist smear review    Standing Status:   Future    Standing Expiration Date:   10/11/2021    All questions were answered. The patient knows to call the clinic with any problems questions or concerns.   Casilda Carls, MD    Return of visit:  Thank you for this kind referral and the opportunity to participate in the care of this patient. A copy of today's note is routed to referring provider    Earlie Server, MD, PhD Hematology Oncology [place] [facility]  10/11/2020

## 2020-10-23 ENCOUNTER — Inpatient Hospital Stay: Payer: Medicare HMO

## 2020-10-23 DIAGNOSIS — D649 Anemia, unspecified: Secondary | ICD-10-CM

## 2020-10-23 DIAGNOSIS — D696 Thrombocytopenia, unspecified: Secondary | ICD-10-CM

## 2020-10-23 LAB — CBC WITH DIFFERENTIAL/PLATELET
Abs Immature Granulocytes: 0.01 10*3/uL (ref 0.00–0.07)
Basophils Absolute: 0 10*3/uL (ref 0.0–0.1)
Basophils Relative: 0 %
Eosinophils Absolute: 0 10*3/uL (ref 0.0–0.5)
Eosinophils Relative: 0 %
HCT: 39.7 % (ref 36.0–46.0)
Hemoglobin: 12.8 g/dL (ref 12.0–15.0)
Immature Granulocytes: 0 %
Lymphocytes Relative: 55 %
Lymphs Abs: 1.4 10*3/uL (ref 0.7–4.0)
MCH: 29.8 pg (ref 26.0–34.0)
MCHC: 32.2 g/dL (ref 30.0–36.0)
MCV: 92.5 fL (ref 80.0–100.0)
Monocytes Absolute: 0.2 10*3/uL (ref 0.1–1.0)
Monocytes Relative: 7 %
Neutro Abs: 1 10*3/uL — ABNORMAL LOW (ref 1.7–7.7)
Neutrophils Relative %: 38 %
Platelets: 128 10*3/uL — ABNORMAL LOW (ref 150–400)
RBC: 4.29 MIL/uL (ref 3.87–5.11)
RDW: 13.8 % (ref 11.5–15.5)
WBC: 2.5 10*3/uL — ABNORMAL LOW (ref 4.0–10.5)
nRBC: 0 % (ref 0.0–0.2)

## 2020-10-23 LAB — COMPREHENSIVE METABOLIC PANEL
ALT: 9 U/L (ref 0–44)
AST: 17 U/L (ref 15–41)
Albumin: 4.5 g/dL (ref 3.5–5.0)
Alkaline Phosphatase: 81 U/L (ref 38–126)
Anion gap: 8 (ref 5–15)
BUN: 21 mg/dL (ref 8–23)
CO2: 27 mmol/L (ref 22–32)
Calcium: 9.5 mg/dL (ref 8.9–10.3)
Chloride: 101 mmol/L (ref 98–111)
Creatinine, Ser: 0.69 mg/dL (ref 0.44–1.00)
GFR, Estimated: >60 mL/min (ref >60)
Glucose, Bld: 86 mg/dL (ref 70–99)
Potassium: 3.9 mmol/L (ref 3.5–5.1)
Sodium: 136 mmol/L (ref 135–145)
Total Bilirubin: 0.8 mg/dL (ref 0.3–1.2)
Total Protein: 8.2 g/dL — ABNORMAL HIGH (ref 6.5–8.1)

## 2020-10-23 LAB — IRON AND TIBC
Iron: 77 ug/dL (ref 28–170)
Saturation Ratios: 25 % (ref 10.4–31.8)
TIBC: 307 ug/dL (ref 250–450)
UIBC: 230 ug/dL

## 2020-10-23 LAB — RETIC PANEL
Immature Retic Fract: 6.6 % (ref 2.3–15.9)
RBC.: 4.28 MIL/uL (ref 3.87–5.11)
Retic Count, Absolute: 57.8 10*3/uL (ref 19.0–186.0)
Retic Ct Pct: 1.4 % (ref 0.4–3.1)
Reticulocyte Hemoglobin: 34.4 pg (ref >27.9)

## 2020-10-23 LAB — TECHNOLOGIST SMEAR REVIEW
Plt Morphology: NORMAL
RBC MORPHOLOGY: NORMAL
WBC MORPHOLOGY: NORMAL

## 2020-10-23 LAB — FOLATE: Folate: 17.1 ng/mL (ref >5.9)

## 2020-10-23 LAB — FERRITIN: Ferritin: 90 ng/mL (ref 11–307)

## 2020-10-23 LAB — IMMATURE PLATELET FRACTION: Immature Platelet Fraction: 5.9 % (ref 1.2–8.6)

## 2020-10-23 LAB — LACTATE DEHYDROGENASE: LDH: 157 U/L (ref 98–192)

## 2020-10-23 LAB — VITAMIN B12: Vitamin B-12: 259 pg/mL (ref 180–914)

## 2020-10-24 LAB — HEPATITIS PANEL, ACUTE
HCV Ab: NONREACTIVE
Hep A IgM: NONREACTIVE
Hep B C IgM: NONREACTIVE
Hepatitis B Surface Ag: NONREACTIVE

## 2020-10-24 LAB — HIV ANTIBODY (ROUTINE TESTING W REFLEX): HIV Screen 4th Generation wRfx: NONREACTIVE

## 2020-10-25 ENCOUNTER — Ambulatory Visit: Payer: Medicare HMO | Admitting: Oncology

## 2020-10-25 LAB — PROTEIN ELECTROPHORESIS, SERUM
A/G Ratio: 1.1 (ref 0.7–1.7)
Albumin ELP: 3.9 g/dL (ref 2.9–4.4)
Alpha-1-Globulin: 0.3 g/dL (ref 0.0–0.4)
Alpha-2-Globulin: 0.6 g/dL (ref 0.4–1.0)
Beta Globulin: 1.1 g/dL (ref 0.7–1.3)
Gamma Globulin: 1.6 g/dL (ref 0.4–1.8)
Globulin, Total: 3.6 g/dL (ref 2.2–3.9)
Total Protein ELP: 7.5 g/dL (ref 6.0–8.5)

## 2020-10-25 LAB — COMP PANEL: LEUKEMIA/LYMPHOMA

## 2020-11-06 ENCOUNTER — Inpatient Hospital Stay: Payer: Medicare HMO | Admitting: Oncology

## 2020-11-06 ENCOUNTER — Ambulatory Visit: Payer: Medicare HMO | Admitting: Oncology

## 2020-11-12 ENCOUNTER — Ambulatory Visit: Payer: Medicare HMO | Admitting: Oncology

## 2020-11-27 ENCOUNTER — Encounter: Payer: Self-pay | Admitting: Oncology

## 2020-11-27 ENCOUNTER — Inpatient Hospital Stay: Payer: Medicare HMO | Attending: Oncology | Admitting: Oncology

## 2020-11-27 ENCOUNTER — Other Ambulatory Visit: Payer: Self-pay

## 2020-11-27 VITALS — BP 130/67 | HR 46 | Temp 97.2°F | Wt 112.1 lb

## 2020-11-27 DIAGNOSIS — C91Z0 Other lymphoid leukemia not having achieved remission: Secondary | ICD-10-CM | POA: Diagnosis present

## 2020-11-27 DIAGNOSIS — Z5982 Transportation insecurity: Secondary | ICD-10-CM | POA: Insufficient documentation

## 2020-11-27 DIAGNOSIS — E538 Deficiency of other specified B group vitamins: Secondary | ICD-10-CM | POA: Diagnosis not present

## 2020-11-27 DIAGNOSIS — Z7689 Persons encountering health services in other specified circumstances: Secondary | ICD-10-CM | POA: Insufficient documentation

## 2020-11-27 DIAGNOSIS — Z993 Dependence on wheelchair: Secondary | ICD-10-CM | POA: Diagnosis not present

## 2020-11-27 DIAGNOSIS — Z7189 Other specified counseling: Secondary | ICD-10-CM

## 2020-11-27 DIAGNOSIS — D696 Thrombocytopenia, unspecified: Secondary | ICD-10-CM | POA: Insufficient documentation

## 2020-11-27 HISTORY — DX: Other lymphoid leukemia not having achieved remission: C91.Z0

## 2020-11-27 MED ORDER — VITAMIN B-12 1000 MCG PO TABS: 1000.0000 ug | ORAL_TABLET | Freq: Every day | ORAL | 1 refills | Status: AC

## 2020-11-27 NOTE — Progress Notes (Signed)
Hematology/Oncology progress  note [facility] Telephone:([phone] [phone]      Patient Care Team: Casilda Carls, MD as PCP - General (Internal Medicine)  REFERRING PROVIDER: Casilda Carls, MD  CHIEF COMPLAINTS/REASON FOR VISIT:  Evaluation of neutropenia, thrombocytopenia and anemia.   HISTORY OF PRESENTING ILLNESS:   Laura Michael is a  86 y.o.  female with PMH listed below was seen in consultation at the request of  Casilda Carls, MD  for evaluation of neutropenia, thrombocytopenia and anemia.   Patient had blood work done recently with PCP.  09/10/2020  Wbc 2.4, ANC 0.9, platelet 144,000, hemoglobin 11.4.   Patient was accompanied by her son. She feels at baseline line heath state. Knee arthralgia, right worse than left side.  Denies weight loss, fever, chills, fatigue, night sweats.    INTERVAL HISTORY Laura Michael is a 86 y.o. female who has above history reviewed by me today presents for follow up visit for management of leukopenia, thrombocytopenia. Patient was accompanied by son today.She has had a blood work done and present to discuss reports.   Review of Systems  Constitutional:  Positive for fatigue. Negative for appetite change, chills and fever.  HENT:   Negative for hearing loss and voice change.   Eyes:  Negative for eye problems.  Respiratory:  Negative for chest tightness and cough.   Cardiovascular:  Negative for chest pain.  Gastrointestinal:  Negative for abdominal distention, abdominal pain and blood in stool.  Endocrine: Negative for hot flashes.  Genitourinary:  Negative for difficulty urinating and frequency.   Musculoskeletal:  Negative for arthralgias.  Skin:  Negative for itching and rash.  Neurological:  Negative for extremity weakness.  Hematological:  Negative for adenopathy.  Psychiatric/Behavioral:  Negative for confusion.    MEDICAL HISTORY:  Past Medical History:  Diagnosis Date    HTN (hypertension)     SURGICAL HISTORY: Past Surgical History:  Procedure Laterality Date   INTRAMEDULLARY (IM) NAIL INTERTROCHANTERIC Left 03/23/2020   Procedure: INTRAMEDULLARY (IM) NAIL INTERTROCHANTRIC;  Surgeon: Leim Fabry, MD;  Location: [facility] ORS;  Service: Orthopedics;  Laterality: Left;   REPLACEMENT TOTAL KNEE Right     SOCIAL HISTORY: Social History   Socioeconomic History   Marital status: Widowed    Spouse name: Not on file   Number of children: Not on file   Years of education: Not on file   Highest education level: Not on file  Occupational History   Not on file  Tobacco Use   Smoking status: Former   Smokeless tobacco: Never  Substance and Sexual Activity   Alcohol use: Never   Drug use: Never   Sexual activity: Not Currently    Birth control/protection: Post-menopausal  Other Topics Concern   Not on file  Social History Narrative   Not on file   Social Determinants of Health   Financial Resource Strain: Not on file  Food Insecurity: Not on file  Transportation Needs: Not on file  Physical Activity: Not on file  Stress: Not on file  Social Connections: Not on file  Intimate Partner Violence: Not on file    FAMILY HISTORY: Family History  Problem Relation Age of Onset   Alzheimer's disease Father    Hypertension Sister     ALLERGIES:  has No Known Allergies.  MEDICATIONS:  Current Outpatient Medications  Medication Sig Dispense Refill   acetaminophen (TYLENOL) 325 MG tablet Take 2 tablets (650 mg total) by mouth every 6 (six) hours as  needed for mild pain, fever or headache.     lisinopril-hydrochlorothiazide (ZESTORETIC) 20-25 MG tablet Take 1 tablet by mouth daily.     meloxicam (MOBIC) 15 MG tablet Take by mouth.     Multiple Vitamin (MULTIVITAMIN WITH MINERALS) TABS tablet Take 1 tablet by mouth daily.     solifenacin (VESICARE) 5 MG tablet Take by mouth.     vitamin B-12 (CYANOCOBALAMIN) 1000 MCG tablet Take 1 tablet (1,000 mcg  total) by mouth daily. 90 tablet 1   Cholecalciferol 1.25 MG (50000 UT) capsule Take by mouth. (Patient not taking: No sig reported)     estradiol (ESTRACE) 0.1 MG/GM vaginal cream Insert pea size amount vaginally nightly x 2 weeks then every other night x 2 weeks, then 2-3 times weekly for maintenance (Patient not taking: No sig reported)     No current facility-administered medications for this visit.     PHYSICAL EXAMINATION: ECOG PERFORMANCE STATUS: 2 - Symptomatic, <50% confined to bed Vitals:   11/27/20 1438  BP: 130/67  Pulse: (!) 46  Temp: (!) 97.2 F (36.2 C)   Filed Weights   11/27/20 1438  Weight: 112 lb 1.6 oz (50.8 kg)    Physical Exam Constitutional:      General: She is not in acute distress.    Comments: She sits in a wheelchair. She walks with walker at home.   HENT:     Head: Normocephalic and atraumatic.  Eyes:     General: No scleral icterus. Cardiovascular:     Rate and Rhythm: Normal rate and regular rhythm.     Heart sounds: Normal heart sounds.  Pulmonary:     Effort: Pulmonary effort is normal. No respiratory distress.     Breath sounds: No wheezing.  Abdominal:     General: Bowel sounds are normal. There is no distension.     Palpations: Abdomen is soft.  Musculoskeletal:        General: No deformity. Normal range of motion.     Cervical back: Normal range of motion and neck supple.  Skin:    General: Skin is warm and dry.     Findings: No erythema or rash.  Neurological:     Mental Status: She is alert. Mental status is at baseline.     Cranial Nerves: No cranial nerve deficit.     Coordination: Coordination normal.  Psychiatric:        Mood and Affect: Mood normal.    LABORATORY DATA:  I have reviewed the data as listed Lab Results  Component Value Date   WBC 2.5 (L) 10/23/2020   HGB 12.8 10/23/2020   HCT 39.7 10/23/2020   MCV 92.5 10/23/2020   PLT 128 (L) 10/23/2020   Recent Labs    05/12/20 1254 05/14/20 0948  10/23/20 1524  NA 142 141 136  K 2.9* 2.9* 3.9  CL 109 107 101  CO2 24 25 27   GLUCOSE 86 83 86  BUN 21 15 21   CREATININE 1.12* 1.00 0.69  CALCIUM 8.1* 8.6* 9.5  GFRNONAA 49* 56* >60  PROT 6.3*  --  8.2*  ALBUMIN 2.8*  --  4.5  AST 15  --  17  ALT 11  --  9  ALKPHOS 87  --  81  BILITOT 0.7  --  0.8    Iron/TIBC/Ferritin/ %Sat    Component Value Date/Time   IRON 77 10/23/2020 1524   TIBC 307 10/23/2020 1524   FERRITIN 90 10/23/2020 1524   IRONPCTSAT 25  10/23/2020 1524      RADIOGRAPHIC STUDIES: I have personally reviewed the radiological images as listed and agreed with the findings in the report. No results found.    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 Low serum vitamin B12   3. Goals of care, counseling/discussion    #Anemia has resolved. #Neutropenia, thrombocytopenia Labs reviewed and discussed with patient. Patient has adequate folate level, iron panel, negative HIV and hepatitis panel.  Protein electrophoresis is negative for M protein. LDH is not high.  Peripheral blood flow cytometry showed a CD8+ T-cell large granular lymphocyte population detected.  Representing 50% of the leukocytes. LGL was discussed with the patient.  ANC is 1.  Recommend observation at this point.  Low vitamin B12 level which can also contribute to neutropenia and thrombocytopenia.  Discussed about option of oral vitamin B12 supplementation versus parenteral B12 injections.  Due to lack of transportation, patient opted to proceed with oral vitamin B12 1000MCG daily.  Prescription sent to pharmacy   Orders Placed This Encounter  Procedures   CBC with Differential/Platelet    Standing Status:   Future    Standing Expiration Date:   11/27/2021   Comprehensive metabolic panel    Standing Status:   Future    Standing Expiration Date:   11/27/2021   Vitamin B12    Standing Status:   Future    Standing Expiration Date:   11/27/2021    All questions were  answered. The patient knows to call the clinic with any problems questions or concerns.   Casilda Carls, MD    Return of visit: 24months Thank you for this kind referral and the opportunity to participate in the care of this patient. A copy of today's note is routed to referring provider    Earlie Server, MD, PhD Hematology Oncology [place] [facility]  11/27/2020

## 2021-03-27 ENCOUNTER — Inpatient Hospital Stay: Payer: Medicare HMO | Attending: Oncology

## 2021-04-01 ENCOUNTER — Inpatient Hospital Stay: Payer: Medicare HMO | Admitting: Oncology

## 2022-05-04 IMAGING — CT CT HEAD W/O CM
3 series · 15 of 46 positions shown, 18 images · non-contrast
Comparison: None.

CLINICAL DATA: 85-year-old female status post fall landing on left
side.

EXAM:
CT HEAD WITHOUT CONTRAST
TECHNIQUE: Contiguous axial images were obtained from the base of the skull
through the vertex without intravenous contrast.

[Series 3: head wo · axial · 0.42mm/px · z∈[-128,-8]mm · 9 of 29 slices shown, 12 images]
[im 3/29  brain]
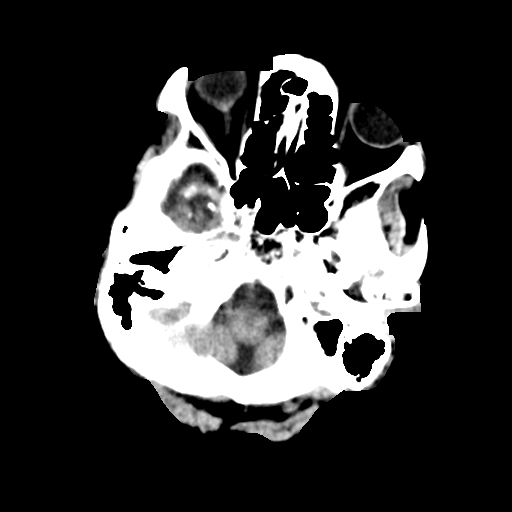
[im 3/29  bone]
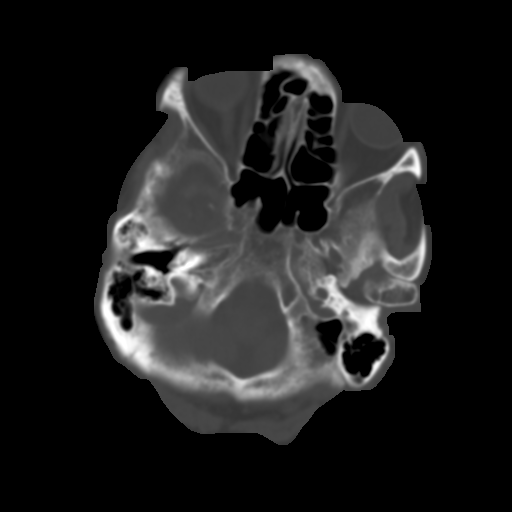
[im 6/29  brain]
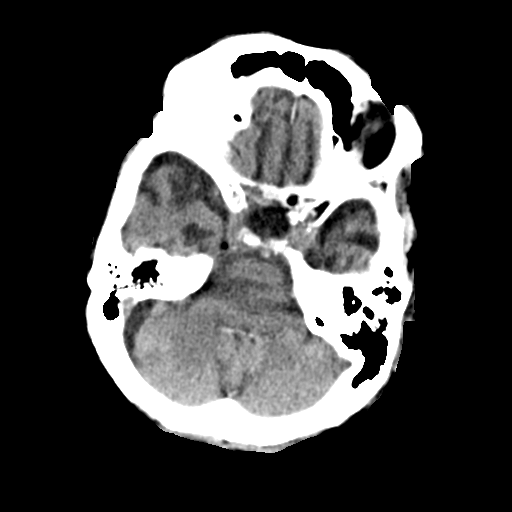
[im 9/29  brain]
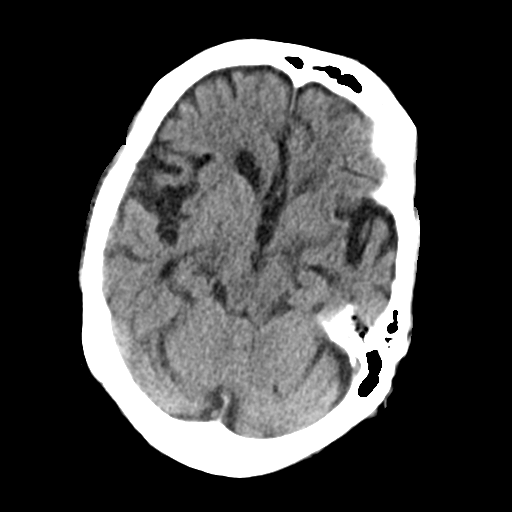
[im 12/29  brain]
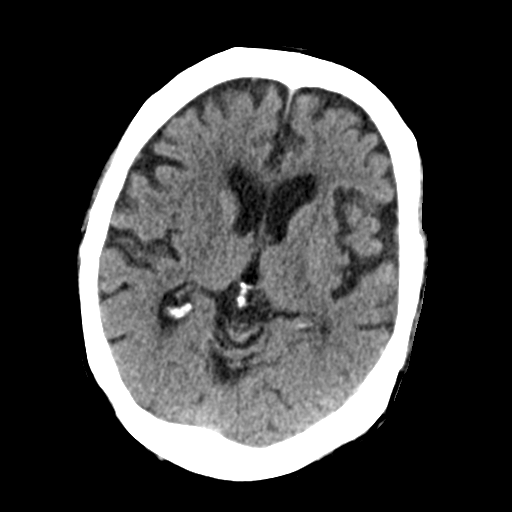
[im 15/29  brain]
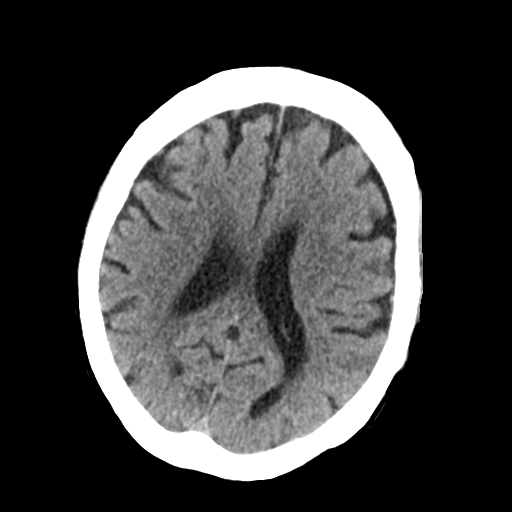
[im 15/29  bone]
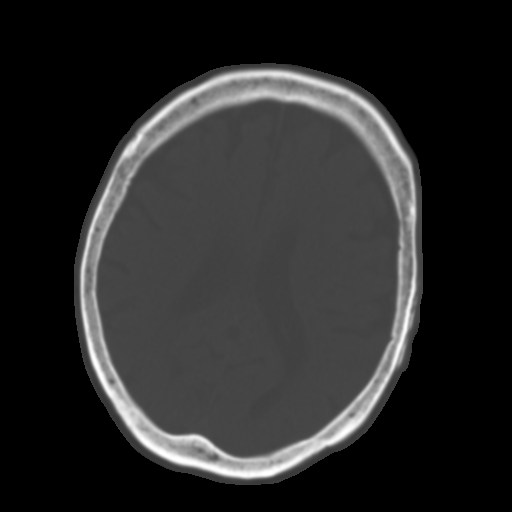
[im 18/29  brain]
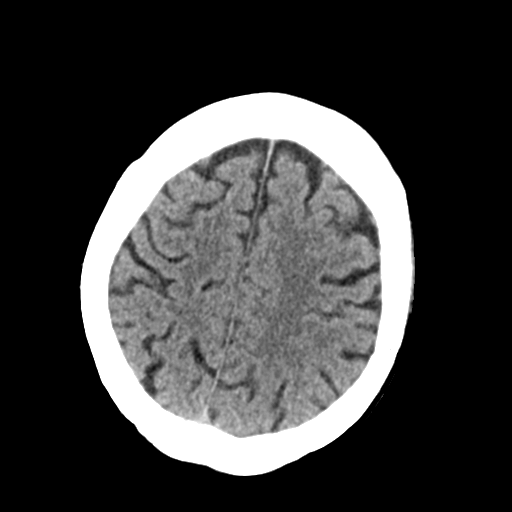
[im 21/29  brain]
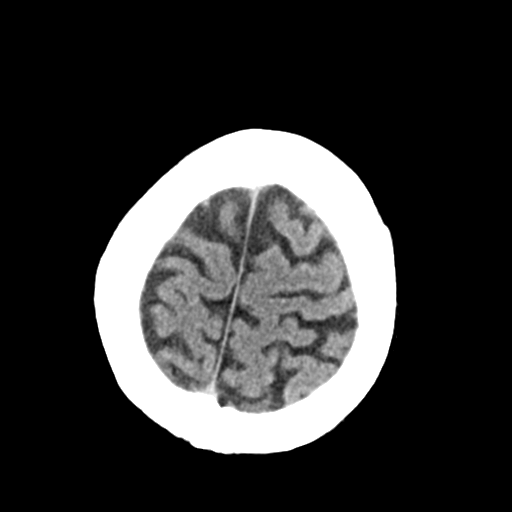
[im 24/29  brain]
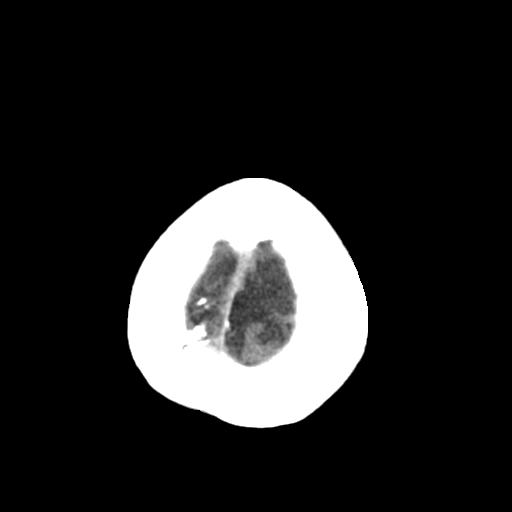
[im 27/29  brain]
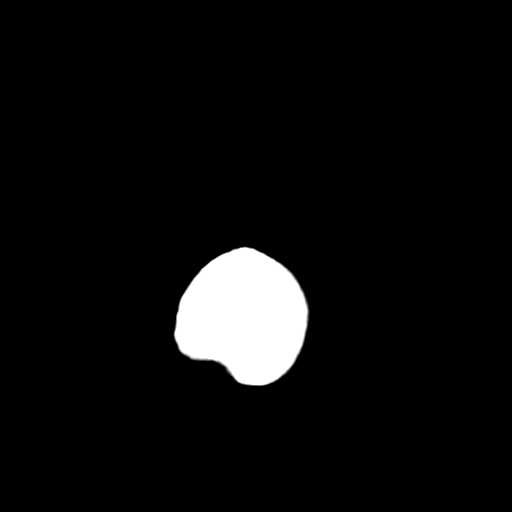
[im 27/29  bone]
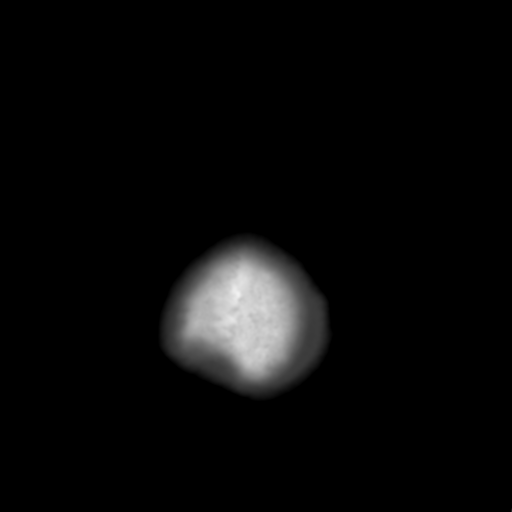

[Series 4: coronal soft tissue · coronal · 0.31mm/px · 3 of 67 slices shown]
[im 23/67  brain]
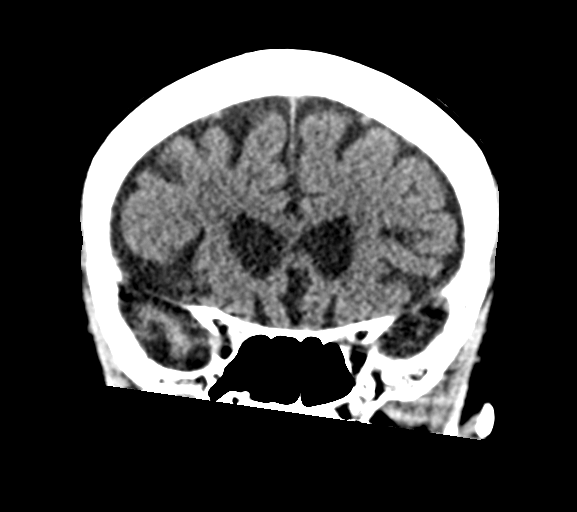
[im 30/67  brain]
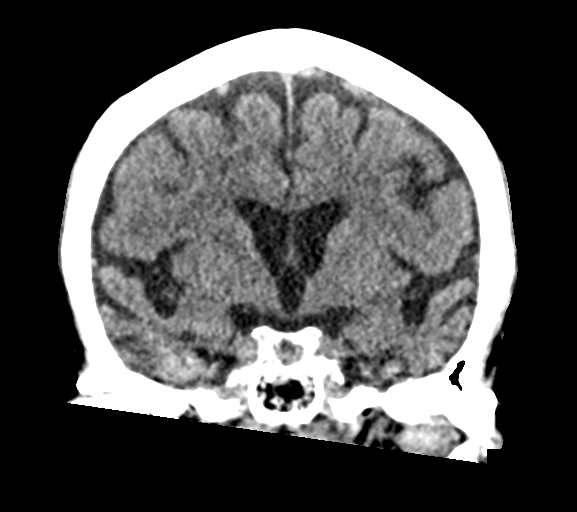
[im 37/67  brain]
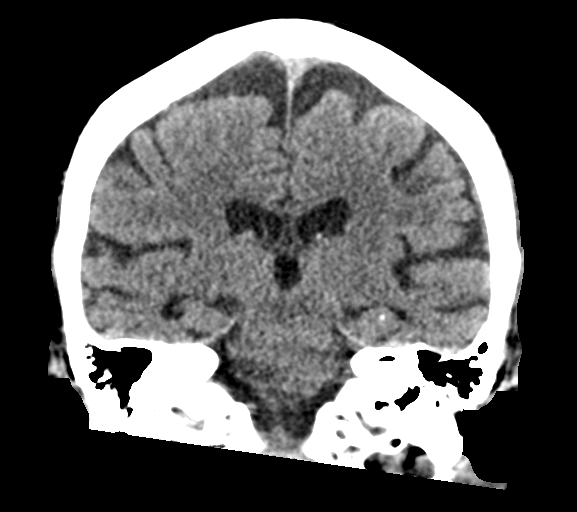

[Series 5: sagittal soft tissue · sagittal · 0.32mm/px · 3 of 52 slices shown]
[im 18/52  brain]
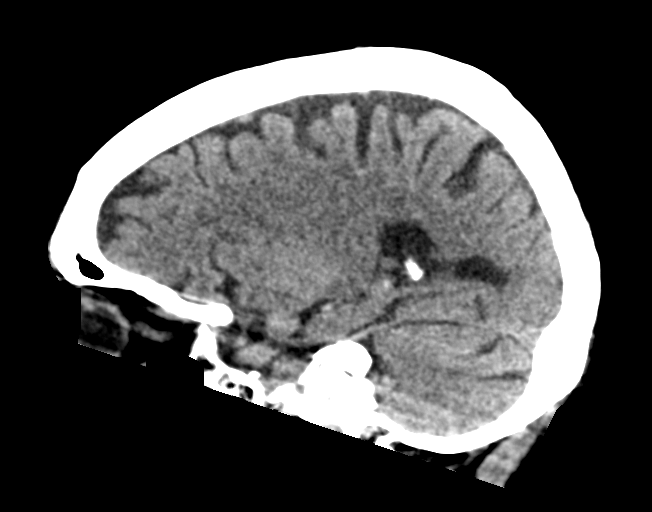
[im 26/52  brain]
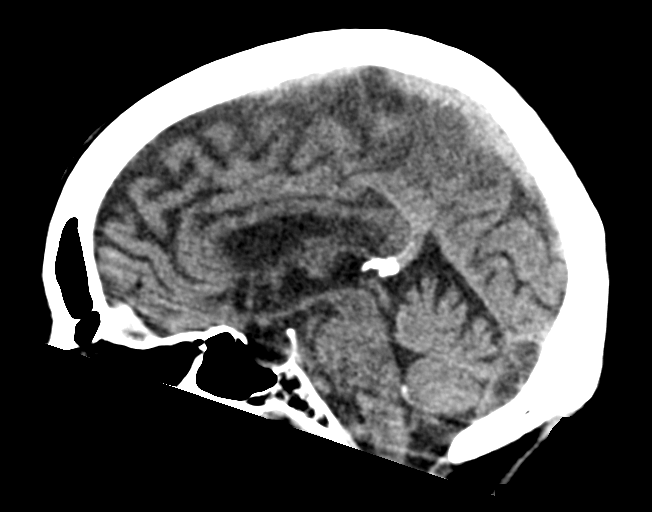
[im 35/52  brain]
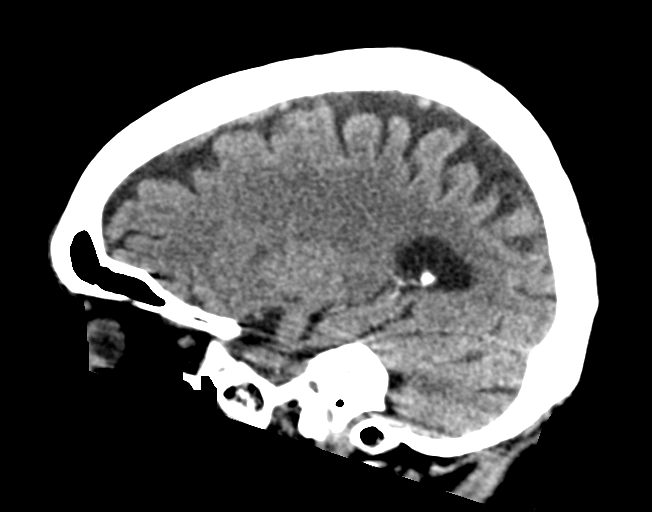

[15 of 46 positions shown; findings below may reference images not displayed]

FINDINGS: Brain: Cerebral volume is within normal limits for age. No midline
shift, ventriculomegaly, mass effect, evidence of mass lesion,
intracranial hemorrhage or evidence of cortically based acute
infarction.

Tiny chronic lacunar infarct suspected in the left cerebellum on
series 3, image 9. Otherwise gray-white matter differentiation is
within normal limits for age. No cortical encephalomalacia
identified.

Vascular: Mild Calcified atherosclerosis at the skull base. No
suspicious intracranial vascular hyperdensity.

Skull: No fracture identified.  Osteopenia at the skull base.

Sinuses/Orbits: Visualized paranasal sinuses and mastoids are clear.

Other: Visualized orbits and scalp soft tissues are within normal
limits.
IMPRESSION: No acute traumatic injury identified.

Largely negative for age non contrast CT appearance of the brain.

## 2023-09-09 ENCOUNTER — Encounter: Payer: Self-pay | Admitting: Oncology

## 2023-10-22 ENCOUNTER — Ambulatory Visit: Payer: Self-pay | Admitting: Cardiology

## 2023-10-22 ENCOUNTER — Ambulatory Visit: Admitting: Cardiology

## 2023-10-29 ENCOUNTER — Ambulatory Visit (INDEPENDENT_AMBULATORY_CARE_PROVIDER_SITE_OTHER): Admitting: Cardiology

## 2023-10-29 ENCOUNTER — Encounter: Payer: Self-pay | Admitting: Cardiology

## 2023-10-29 VITALS — BP 108/78 | HR 47 | Ht 60.0 in | Wt 114.2 lb

## 2023-10-29 DIAGNOSIS — Z7689 Persons encountering health services in other specified circumstances: Secondary | ICD-10-CM | POA: Diagnosis not present

## 2023-10-29 DIAGNOSIS — E559 Vitamin D deficiency, unspecified: Secondary | ICD-10-CM | POA: Insufficient documentation

## 2023-10-29 DIAGNOSIS — I10 Essential (primary) hypertension: Secondary | ICD-10-CM | POA: Diagnosis not present

## 2023-10-29 MED ORDER — CHOLECALCIFEROL 1.25 MG (50000 UT) PO CAPS: 50000.0000 [IU] | ORAL_CAPSULE | ORAL | 6 refills | Status: AC

## 2023-10-29 NOTE — Progress Notes (Signed)
 New Patient Office Visit  Subjective   Patient ID: Laura Michael, female    DOB: 1934-10-02  Age: 89 y.o. MRN: [id number]  CC:  Chief Complaint  Patient presents with   New Patient (Initial Visit)    [facility] patient    HPI Laura Michael presents to establish care Previous Primary Care provider/office:   she does not have additional concerns to discuss today.   Patient in office to establish care. Patient doing well, no new complaints.  Blood pressure improved on recheck. Heart rate low, denies dizziness.  Continue same medications.     Outpatient Encounter Medications as of 10/29/2023  Medication Sig   acetaminophen  (TYLENOL ) 325 MG tablet Take 2 tablets (650 mg total) by mouth every 6 (six) hours as needed for mild pain, fever or headache.   atorvastatin (LIPITOR) 10 MG tablet Take 10 mg by mouth daily.   estradiol (ESTRACE) 0.1 MG/GM vaginal cream Insert pea size amount vaginally nightly x 2 weeks then every other night x 2 weeks, then 2-3 times weekly for maintenance   lisinopril -hydrochlorothiazide  (ZESTORETIC ) 20-25 MG tablet Take 1 tablet by mouth daily.   [DISCONTINUED] Cholecalciferol 1.25 MG (50000 UT) capsule Take by mouth.   Cholecalciferol 1.25 MG (50000 UT) capsule Take 1 capsule (50,000 Units total) by mouth once a week.   solifenacin (VESICARE) 5 MG tablet Take by mouth.   vitamin B-12 (CYANOCOBALAMIN) 1000 MCG tablet Take 1 tablet (1,000 mcg total) by mouth daily.   [DISCONTINUED] Multiple Vitamin (MULTIVITAMIN WITH MINERALS) TABS tablet Take 1 tablet by mouth daily. (Patient not taking: Reported on 10/29/2023)   No facility-administered encounter medications on file as of 10/29/2023.    Past Medical History:  Diagnosis Date   HTN (hypertension)     Past Surgical History:  Procedure Laterality Date   INTRAMEDULLARY (IM) NAIL INTERTROCHANTERIC Left 03/23/2020   Procedure: INTRAMEDULLARY (IM) NAIL INTERTROCHANTRIC;  Surgeon: Tobie Priest, MD;   Location: [facility] ORS;  Service: Orthopedics;  Laterality: Left;   REPLACEMENT TOTAL KNEE Right     Family History  Problem Relation Age of Onset   Alzheimer's disease Father    Hypertension Sister     Social History   Socioeconomic History   Marital status: Widowed    Spouse name: Not on file   Number of children: Not on file   Years of education: Not on file   Highest education level: Not on file  Occupational History   Not on file  Tobacco Use   Smoking status: Former   Smokeless tobacco: Never  Substance and Sexual Activity   Alcohol use: Never   Drug use: Never   Sexual activity: Not Currently    Birth control/protection: Post-menopausal  Other Topics Concern   Not on file  Social History Narrative   Not on file   Social Drivers of Health   Financial Resource Strain: Not on file  Food Insecurity: Not on file  Transportation Needs: Not on file  Physical Activity: Not on file  Stress: Not on file  Social Connections: Not on file  Intimate Partner Violence: Not on file    Review of Systems  Constitutional: Negative.   HENT: Negative.    Eyes: Negative.   Respiratory: Negative.  Negative for shortness of breath.   Cardiovascular: Negative.  Negative for chest pain.  Gastrointestinal: Negative.  Negative for abdominal pain, constipation and diarrhea.  Genitourinary: Negative.   Musculoskeletal:  Negative for joint pain and myalgias.  Skin: Negative.  Neurological: Negative.  Negative for dizziness and headaches.  Endo/Heme/Allergies: Negative.   All other systems reviewed and are negative.     Objective   BP 108/78 (BP Location: Right Arm, Patient Position: Sitting, Cuff Size: Small)   Pulse (!) 47   Ht 5' (1.524 m)   Wt 114 lb 3.2 oz (51.8 kg)   SpO2 100%   BMI 22.30 kg/m   Physical Exam Vitals and nursing note reviewed.  Constitutional:      Appearance: Normal appearance. She is normal weight.  HENT:     Head: Normocephalic and atraumatic.      Nose: Nose normal.     Mouth/Throat:     Mouth: Mucous membranes are moist.  Eyes:     Extraocular Movements: Extraocular movements intact.     Conjunctiva/sclera: Conjunctivae normal.     Pupils: Pupils are equal, round, and reactive to light.  Cardiovascular:     Rate and Rhythm: Normal rate and regular rhythm.     Pulses: Normal pulses.     Heart sounds: Normal heart sounds.  Pulmonary:     Effort: Pulmonary effort is normal.     Breath sounds: Normal breath sounds.  Abdominal:     General: Abdomen is flat. Bowel sounds are normal.     Palpations: Abdomen is soft.  Musculoskeletal:        General: Normal range of motion.     Cervical back: Normal range of motion.  Skin:    General: Skin is warm and dry.  Neurological:     General: No focal deficit present.     Mental Status: She is alert and oriented to person, place, and time.  Psychiatric:        Mood and Affect: Mood normal.        Behavior: Behavior normal.        Thought Content: Thought content normal.        Judgment: Judgment normal.        Assessment & Plan:  Continue same medications.   Problem List Items Addressed This Visit       Cardiovascular and Mediastinum   HTN (hypertension)   Relevant Medications   atorvastatin (LIPITOR) 10 MG tablet     Other   Encounter to establish care - Primary   Vitamin D deficiency    Return in about 3 months (around 01/29/2024) for fasting lab work prior.   Total time spent: 25 minutes  [employer], NP  10/29/2023   This document may have been prepared by Dragon Voice Recognition software and as such may include unintentional dictation errors.

## 2024-02-05 ENCOUNTER — Ambulatory Visit: Admitting: Cardiology

## 2024-02-18 ENCOUNTER — Ambulatory Visit: Admitting: Cardiology

## 2024-02-18 ENCOUNTER — Encounter: Payer: Self-pay | Admitting: Cardiology

## 2024-02-18 VITALS — BP 124/84 | HR 57 | Ht 60.0 in | Wt 115.4 lb

## 2024-02-18 DIAGNOSIS — D649 Anemia, unspecified: Secondary | ICD-10-CM | POA: Diagnosis not present

## 2024-02-18 DIAGNOSIS — I10 Essential (primary) hypertension: Secondary | ICD-10-CM | POA: Diagnosis not present

## 2024-02-18 DIAGNOSIS — Z1329 Encounter for screening for other suspected endocrine disorder: Secondary | ICD-10-CM | POA: Diagnosis not present

## 2024-02-18 DIAGNOSIS — Z13220 Encounter for screening for lipoid disorders: Secondary | ICD-10-CM | POA: Diagnosis not present

## 2024-02-18 DIAGNOSIS — E559 Vitamin D deficiency, unspecified: Secondary | ICD-10-CM

## 2024-02-18 DIAGNOSIS — Z131 Encounter for screening for diabetes mellitus: Secondary | ICD-10-CM | POA: Diagnosis not present

## 2024-02-18 DIAGNOSIS — E538 Deficiency of other specified B group vitamins: Secondary | ICD-10-CM

## 2024-02-18 NOTE — Progress Notes (Signed)
 "  Established Patient Office Visit  Subjective:  Patient ID: Laura Michael, female    DOB: 06-24-1934  Age: 89 y.o. MRN: [id number]  Chief Complaint  Patient presents with   Follow-up    3 month lab results. I don't think the pt is taking her medications properly, she has run completely out of bp medication.    Patient in office for 3 month follow up. Has not had blood work done. Patient has no complaints today.  Will do lab work, call with results.  Blood pressure improved on recheck.  Continue current medications.    No other concerns at this time.   Past Medical History:  Diagnosis Date   Acute blood loss anemia 03/28/2020   Closed left hip fracture (HCC) 03/23/2020   Fall 03/23/2020   HTN (hypertension)    Large granular lymphocytic leukemia ([facility]) 11/27/2020    Past Surgical History:  Procedure Laterality Date   INTRAMEDULLARY (IM) NAIL INTERTROCHANTERIC Left 03/23/2020   Procedure: INTRAMEDULLARY (IM) NAIL INTERTROCHANTRIC;  Surgeon: Tobie Priest, MD;  Location: [facility] ORS;  Service: Orthopedics;  Laterality: Left;   REPLACEMENT TOTAL KNEE Right     Social History   Socioeconomic History   Marital status: Widowed    Spouse name: Not on file   Number of children: Not on file   Years of education: Not on file   Highest education level: Not on file  Occupational History   Not on file  Tobacco Use   Smoking status: Former   Smokeless tobacco: Never  Substance and Sexual Activity   Alcohol use: Never   Drug use: Never   Sexual activity: Not Currently    Birth control/protection: Post-menopausal  Other Topics Concern   Not on file  Social History Narrative   Not on file   Social Drivers of Health   Tobacco Use: Medium Risk (02/18/2024)   Patient History    Smoking Tobacco Use: Former    Smokeless Tobacco Use: Never    Passive Exposure: Not on Actuary Strain: Not on file  Food Insecurity: Not on file  Transportation Needs: Not on file   Physical Activity: Not on file  Stress: Not on file  Social Connections: Not on file  Intimate Partner Violence: Not on file  Depression (EYV7-0): Not on file  Alcohol Screen: Not on file  Housing: Not on file  Utilities: Not on file  Health Literacy: Not on file    Family History  Problem Relation Age of Onset   Alzheimer's disease Father    Hypertension Sister     Allergies[1]  Show/hide medication list[2]  Review of Systems  Constitutional: Negative.   HENT: Negative.    Eyes: Negative.   Respiratory: Negative.  Negative for shortness of breath.   Cardiovascular: Negative.  Negative for chest pain.  Gastrointestinal: Negative.  Negative for abdominal pain, constipation and diarrhea.  Genitourinary: Negative.   Musculoskeletal:  Negative for joint pain and myalgias.  Skin: Negative.   Neurological: Negative.  Negative for dizziness and headaches.  Endo/Heme/Allergies: Negative.   All other systems reviewed and are negative.      Objective:   BP 124/84 (BP Location: Right Arm, Patient Position: Sitting, Cuff Size: Small)   Pulse (!) 57   Ht 5' (1.524 m)   Wt 115 lb 6.4 oz (52.3 kg)   SpO2 96%   BMI 22.54 kg/m   Vitals:   02/18/24 1424 02/18/24 1444  BP: (!) 162/90 124/84  Pulse:  ROLLEN)  57  Height: 5' (1.524 m)   Weight: 115 lb 6.4 oz (52.3 kg)   SpO2:  96%  BMI (Calculated): 22.54     Physical Exam Vitals and nursing note reviewed.  Constitutional:      Appearance: Normal appearance. She is normal weight.  HENT:     Head: Normocephalic and atraumatic.     Nose: Nose normal.     Mouth/Throat:     Mouth: Mucous membranes are moist.  Eyes:     Extraocular Movements: Extraocular movements intact.     Conjunctiva/sclera: Conjunctivae normal.     Pupils: Pupils are equal, round, and reactive to light.  Cardiovascular:     Rate and Rhythm: Normal rate and regular rhythm.     Pulses: Normal pulses.     Heart sounds: Normal heart sounds.  Pulmonary:      Effort: Pulmonary effort is normal.     Breath sounds: Normal breath sounds.  Abdominal:     General: Abdomen is flat. Bowel sounds are normal.     Palpations: Abdomen is soft.  Musculoskeletal:        General: Normal range of motion.     Cervical back: Normal range of motion.  Skin:    General: Skin is warm and dry.  Neurological:     General: No focal deficit present.     Mental Status: She is alert and oriented to person, place, and time.  Psychiatric:        Mood and Affect: Mood normal.        Behavior: Behavior normal.        Thought Content: Thought content normal.        Judgment: Judgment normal.      No results found for any visits on 02/18/24.  No results found for this or any previous visit (from the past 2160 hours).    Assessment & Plan:  Blood work today Continue same medications  Problem List Items Addressed This Visit       Cardiovascular and Mediastinum   HTN (hypertension) - Primary   Relevant Medications   lisinopril  (ZESTRIL ) 40 MG tablet   Other Relevant Orders   CMP14+EGFR     Other   Normocytic anemia   Relevant Orders   CBC with Differential/Platelet   Iron, TIBC and Ferritin Panel   Low serum vitamin B12   Relevant Orders   Vitamin B12   Vitamin D deficiency   Relevant Orders   Vitamin D (25 hydroxy)   Other Visit Diagnoses       Diabetes mellitus screening       Relevant Orders   Hemoglobin A1c     Thyroid disorder screening       Relevant Orders   TSH     Lipid screening       Relevant Orders   Lipid Profile       Return in about 3 months (around 05/18/2024).   Total time spent: 25 minutes. This time includes review of previous notes and results and patient face to face interaction during today's visit.    Jeoffrey Pollen, NP  02/18/2024   This document may have been prepared by [facility] Voice Recognition software and as such may include unintentional dictation errors.     [1] No Known Allergies [2]   Outpatient Medications Prior to Visit  Medication Sig   acetaminophen  (TYLENOL ) 325 MG tablet Take 2 tablets (650 mg total) by mouth every 6 (six) hours as needed for mild pain,  fever or headache.   atorvastatin (LIPITOR) 10 MG tablet Take 10 mg by mouth daily.   Cholecalciferol  1.25 MG (50000 UT) capsule Take 1 capsule (50,000 Units total) by mouth once a week.   estradiol (ESTRACE) 0.1 MG/GM vaginal cream Insert pea size amount vaginally nightly x 2 weeks then every other night x 2 weeks, then 2-3 times weekly for maintenance   lisinopril  (ZESTRIL ) 40 MG tablet Take 40 mg by mouth daily.   solifenacin (VESICARE) 5 MG tablet Take by mouth.   vitamin B-12 (CYANOCOBALAMIN ) 1000 MCG tablet Take 1 tablet (1,000 mcg total) by mouth daily.   [DISCONTINUED] lisinopril -hydrochlorothiazide  (ZESTORETIC ) 20-25 MG tablet Take 1 tablet by mouth daily. (Patient not taking: Reported on 02/18/2024)   No facility-administered medications prior to visit.   "

## 2024-02-19 LAB — CMP14+EGFR
ALT: 9 IU/L (ref 0–32)
AST: 16 IU/L (ref 0–40)
Albumin: 4.3 g/dL (ref 3.7–4.7)
Alkaline Phosphatase: 78 IU/L (ref 48–129)
BUN/Creatinine Ratio: 16 (ref 12–28)
BUN: 14 mg/dL (ref 8–27)
Bilirubin Total: 0.5 mg/dL (ref 0.0–1.2)
CO2: 20 mmol/L (ref 20–29)
Calcium: 9.5 mg/dL (ref 8.7–10.3)
Chloride: 105 mmol/L (ref 96–106)
Creatinine, Ser: 0.85 mg/dL (ref 0.57–1.00)
Globulin, Total: 2.9 g/dL (ref 1.5–4.5)
Glucose: 73 mg/dL (ref 70–99)
Potassium: 4.2 mmol/L (ref 3.5–5.2)
Sodium: 144 mmol/L (ref 134–144)
Total Protein: 7.2 g/dL (ref 6.0–8.5)
eGFR: 66 mL/min/1.73

## 2024-02-19 LAB — CBC WITH DIFFERENTIAL/PLATELET
Basophils Absolute: 0 x10E3/uL (ref 0.0–0.2)
Basos: 0 %
EOS (ABSOLUTE): 0 x10E3/uL (ref 0.0–0.4)
Eos: 1 %
Hematocrit: 36.2 % (ref 34.0–46.6)
Hemoglobin: 11.4 g/dL (ref 11.1–15.9)
Immature Grans (Abs): 0 x10E3/uL (ref 0.0–0.1)
Immature Granulocytes: 0 %
Lymphocytes Absolute: 1.2 x10E3/uL (ref 0.7–3.1)
Lymphs: 45 %
MCH: 27.9 pg (ref 26.6–33.0)
MCHC: 31.5 g/dL (ref 31.5–35.7)
MCV: 89 fL (ref 79–97)
Monocytes Absolute: 0.2 x10E3/uL (ref 0.1–0.9)
Monocytes: 7 %
Neutrophils Absolute: 1.3 x10E3/uL — ABNORMAL LOW (ref 1.4–7.0)
Neutrophils: 47 %
Platelets: 162 x10E3/uL (ref 150–450)
RBC: 4.08 x10E6/uL (ref 3.77–5.28)
RDW: 13.7 % (ref 11.7–15.4)
WBC: 2.7 x10E3/uL — ABNORMAL LOW (ref 3.4–10.8)

## 2024-02-19 LAB — TSH: TSH: 2.18 u[IU]/mL (ref 0.450–4.500)

## 2024-02-19 LAB — HEMOGLOBIN A1C
Est. average glucose Bld gHb Est-mCnc: 88 mg/dL
Hgb A1c MFr Bld: 4.7 % — ABNORMAL LOW (ref 4.8–5.6)

## 2024-02-19 LAB — LIPID PANEL
Chol/HDL Ratio: 3.5 ratio (ref 0.0–4.4)
Cholesterol, Total: 218 mg/dL — ABNORMAL HIGH (ref 100–199)
HDL: 63 mg/dL
LDL Chol Calc (NIH): 138 mg/dL — ABNORMAL HIGH (ref 0–99)
Triglycerides: 99 mg/dL (ref 0–149)
VLDL Cholesterol Cal: 17 mg/dL (ref 5–40)

## 2024-02-19 LAB — IRON,TIBC AND FERRITIN PANEL
Ferritin: 71 ng/mL (ref 15–150)
Iron Saturation: 22 % (ref 15–55)
Iron: 62 ug/dL (ref 27–139)
Total Iron Binding Capacity: 277 ug/dL (ref 250–450)
UIBC: 215 ug/dL (ref 118–369)

## 2024-02-19 LAB — VITAMIN D 25 HYDROXY (VIT D DEFICIENCY, FRACTURES): Vit D, 25-Hydroxy: 60.9 ng/mL (ref 30.0–100.0)

## 2024-02-19 LAB — VITAMIN B12: Vitamin B-12: 359 pg/mL (ref 232–1245)

## 2024-02-23 ENCOUNTER — Other Ambulatory Visit: Payer: Self-pay

## 2024-02-23 MED ORDER — LISINOPRIL 40 MG PO TABS: 40.0000 mg | ORAL_TABLET | Freq: Every day | ORAL | 1 refills | Status: AC

## 2024-02-23 NOTE — Telephone Encounter (Signed)
 Pt LM asking if [facility] has an rx for her but didn't leave a name or dob or CB #, I was able to get the number off the caller ID

## 2024-02-23 NOTE — Telephone Encounter (Signed)
 Pt called requesting refill on bp rx, please advise

## 2024-02-24 ENCOUNTER — Ambulatory Visit: Payer: Self-pay | Admitting: Cardiology

## 2024-02-25 ENCOUNTER — Other Ambulatory Visit: Payer: Self-pay | Admitting: Cardiology

## 2024-02-25 DIAGNOSIS — D72819 Decreased white blood cell count, unspecified: Secondary | ICD-10-CM

## 2024-02-25 DIAGNOSIS — D649 Anemia, unspecified: Secondary | ICD-10-CM

## 2024-02-25 MED ORDER — ATORVASTATIN CALCIUM 20 MG PO TABS: 20.0000 mg | ORAL_TABLET | Freq: Every day | ORAL | 11 refills | Status: AC

## 2024-05-20 ENCOUNTER — Ambulatory Visit: Admitting: Internal Medicine
